# Patient Record
Sex: Female | Born: 1960 | Race: Black or African American | Hispanic: No | Smoking: Current every day smoker
Health system: Southern US, Community
[De-identification: ages and names within clinical notes are randomized; demographics above are authoritative.]

## PROBLEM LIST (undated history)

## (undated) DIAGNOSIS — F419 Anxiety disorder, unspecified: Secondary | ICD-10-CM

## (undated) DIAGNOSIS — F32A Depression, unspecified: Secondary | ICD-10-CM

## (undated) DIAGNOSIS — F319 Bipolar disorder, unspecified: Secondary | ICD-10-CM

## (undated) DIAGNOSIS — E785 Hyperlipidemia, unspecified: Secondary | ICD-10-CM

## (undated) DIAGNOSIS — F329 Major depressive disorder, single episode, unspecified: Secondary | ICD-10-CM

## (undated) HISTORY — DX: Hyperlipidemia, unspecified: E78.5

## (undated) HISTORY — DX: Major depressive disorder, single episode, unspecified: F32.9

## (undated) HISTORY — DX: Anxiety disorder, unspecified: F41.9

## (undated) HISTORY — PX: TUMOR EXCISION: SHX421

## (undated) HISTORY — PX: ABDOMINAL HYSTERECTOMY: SHX81

## (undated) HISTORY — DX: Depression, unspecified: F32.A

---

## 1998-12-05 ENCOUNTER — Emergency Department (HOSPITAL_COMMUNITY): Admission: EM | Admit: 1998-12-05 | Discharge: 1998-12-05 | Payer: Self-pay | Admitting: Emergency Medicine

## 1998-12-05 ENCOUNTER — Encounter: Payer: Self-pay | Admitting: Emergency Medicine

## 2000-11-29 ENCOUNTER — Emergency Department (HOSPITAL_COMMUNITY): Admission: EM | Admit: 2000-11-29 | Discharge: 2000-11-29 | Payer: Self-pay | Admitting: Emergency Medicine

## 2000-11-29 ENCOUNTER — Encounter: Payer: Self-pay | Admitting: Emergency Medicine

## 2002-12-19 ENCOUNTER — Emergency Department (HOSPITAL_COMMUNITY): Admission: AD | Admit: 2002-12-19 | Discharge: 2002-12-19 | Payer: Self-pay | Admitting: Family Medicine

## 2005-04-18 ENCOUNTER — Emergency Department (HOSPITAL_COMMUNITY): Admission: EM | Admit: 2005-04-18 | Discharge: 2005-04-18 | Payer: Self-pay | Admitting: Family Medicine

## 2005-11-14 ENCOUNTER — Emergency Department (HOSPITAL_COMMUNITY): Admission: EM | Admit: 2005-11-14 | Discharge: 2005-11-14 | Payer: Self-pay | Admitting: Emergency Medicine

## 2006-06-11 ENCOUNTER — Emergency Department (HOSPITAL_COMMUNITY): Admission: EM | Admit: 2006-06-11 | Discharge: 2006-06-11 | Payer: Self-pay | Admitting: Emergency Medicine

## 2006-09-13 ENCOUNTER — Emergency Department (HOSPITAL_COMMUNITY): Admission: EM | Admit: 2006-09-13 | Discharge: 2006-09-13 | Payer: Self-pay | Admitting: Emergency Medicine

## 2006-12-04 ENCOUNTER — Emergency Department (HOSPITAL_COMMUNITY): Admission: EM | Admit: 2006-12-04 | Discharge: 2006-12-04 | Payer: Self-pay | Admitting: Emergency Medicine

## 2007-11-13 ENCOUNTER — Emergency Department (HOSPITAL_COMMUNITY): Admission: EM | Admit: 2007-11-13 | Discharge: 2007-11-13 | Payer: Self-pay | Admitting: Emergency Medicine

## 2008-04-25 ENCOUNTER — Emergency Department (HOSPITAL_COMMUNITY): Admission: EM | Admit: 2008-04-25 | Discharge: 2008-04-25 | Payer: Self-pay | Admitting: Emergency Medicine

## 2008-04-28 ENCOUNTER — Emergency Department (HOSPITAL_COMMUNITY): Admission: EM | Admit: 2008-04-28 | Discharge: 2008-04-28 | Payer: Self-pay | Admitting: Emergency Medicine

## 2008-10-04 IMAGING — CR DG CHEST 2V
2 series · 2 of 2 positions shown · non-contrast
Comparison: No comparison.

CLINICAL DATA: Fever, weakness, and cough.
 CHEST - 2 VIEW:

[w chest pa]
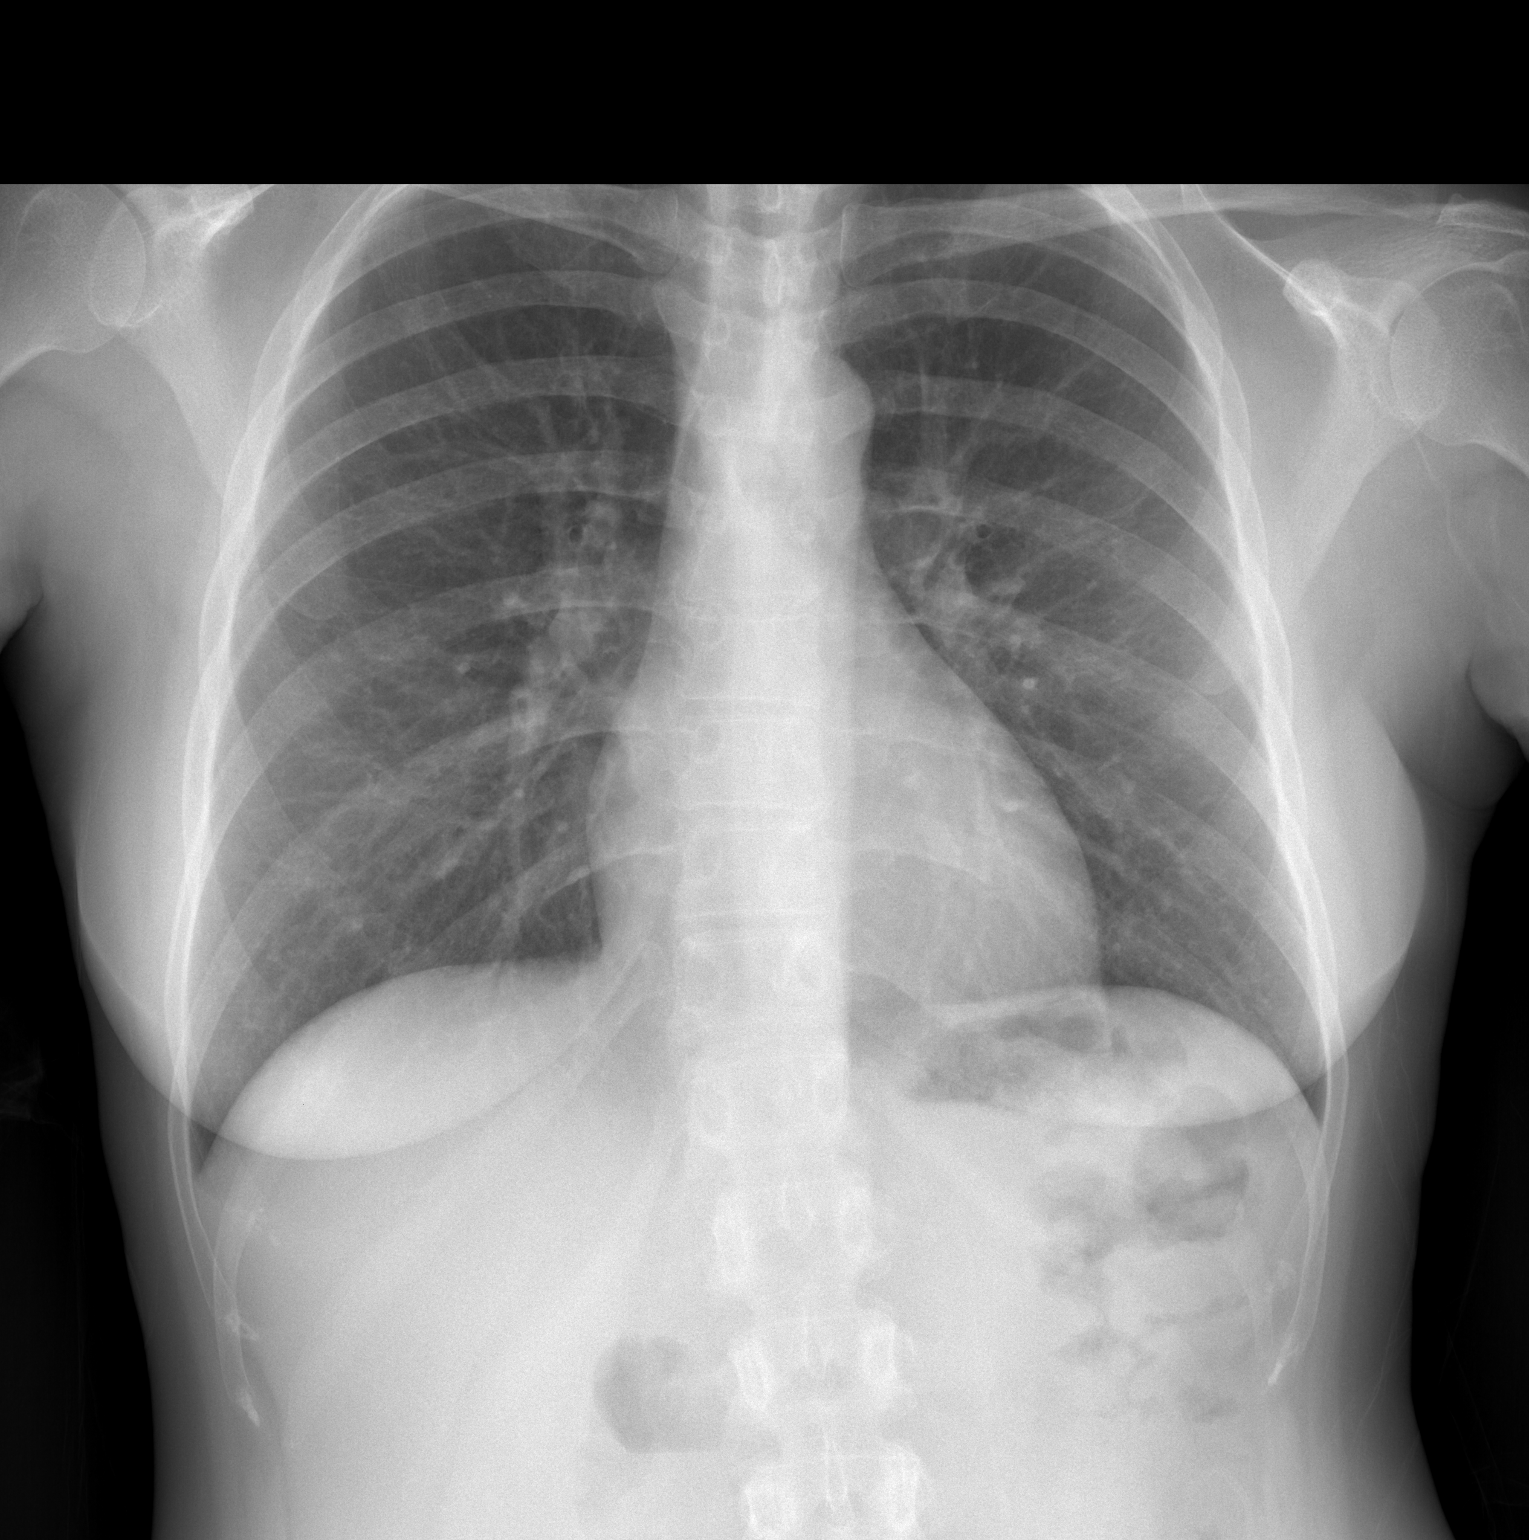

[w chest lat]
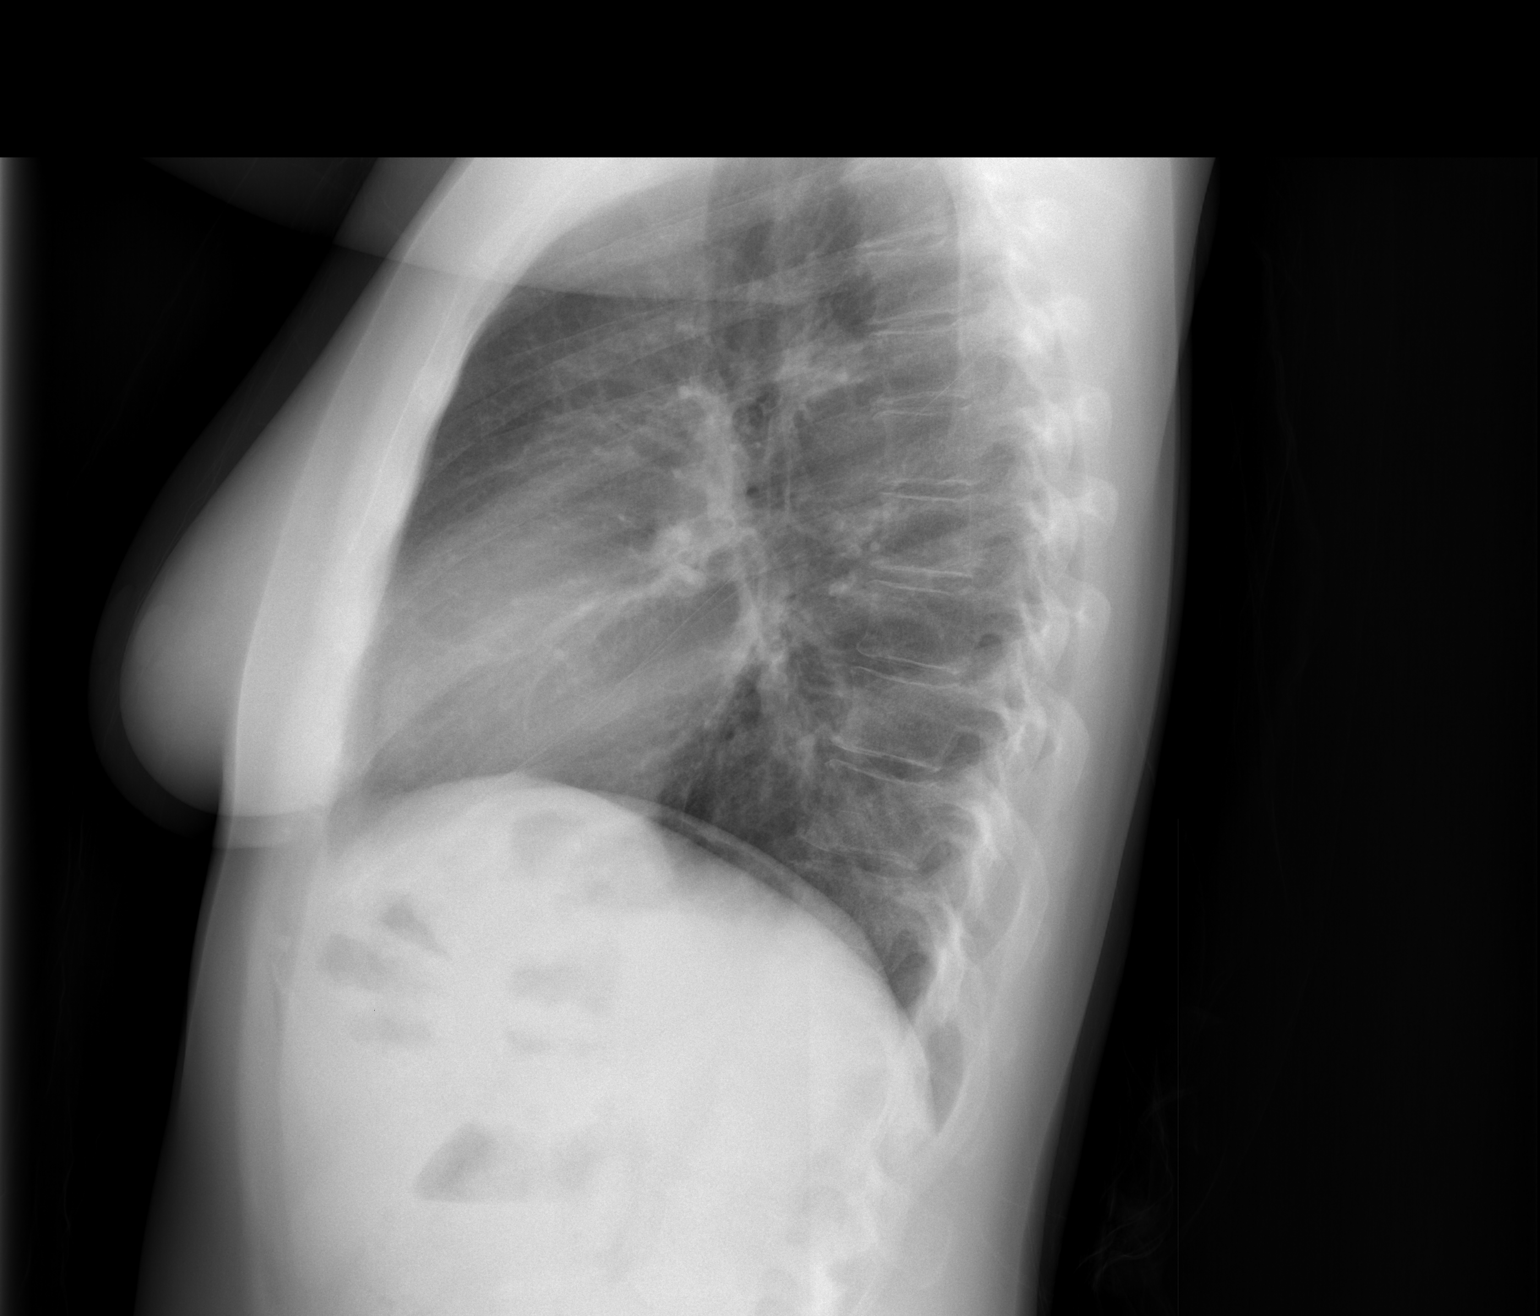

[2 of 2 positions shown; findings below may reference images not displayed]

FINDINGS: Heart size is normal. There is no infiltrate or effusion.  No mass is identified.
IMPRESSION: No acute abnormality.

## 2010-02-20 ENCOUNTER — Emergency Department (HOSPITAL_COMMUNITY)
Admission: EM | Admit: 2010-02-20 | Discharge: 2010-02-20 | Payer: Self-pay | Source: Home / Self Care | Admitting: Emergency Medicine

## 2010-05-13 ENCOUNTER — Emergency Department (HOSPITAL_COMMUNITY)
Admission: EM | Admit: 2010-05-13 | Discharge: 2010-05-13 | Disposition: A | Discharge status: Home / Self Care | Payer: Self-pay | Attending: Emergency Medicine | Admitting: Emergency Medicine

## 2010-05-13 ENCOUNTER — Emergency Department (HOSPITAL_COMMUNITY): Payer: Self-pay

## 2010-05-13 DIAGNOSIS — R0989 Other specified symptoms and signs involving the circulatory and respiratory systems: Secondary | ICD-10-CM | POA: Insufficient documentation

## 2010-05-13 DIAGNOSIS — R509 Fever, unspecified: Secondary | ICD-10-CM | POA: Insufficient documentation

## 2010-05-13 DIAGNOSIS — R0609 Other forms of dyspnea: Secondary | ICD-10-CM | POA: Insufficient documentation

## 2010-05-13 DIAGNOSIS — F319 Bipolar disorder, unspecified: Secondary | ICD-10-CM | POA: Insufficient documentation

## 2010-05-13 DIAGNOSIS — R112 Nausea with vomiting, unspecified: Secondary | ICD-10-CM | POA: Insufficient documentation

## 2010-05-13 DIAGNOSIS — R059 Cough, unspecified: Secondary | ICD-10-CM | POA: Insufficient documentation

## 2010-05-13 DIAGNOSIS — R05 Cough: Secondary | ICD-10-CM | POA: Insufficient documentation

## 2010-05-13 DIAGNOSIS — J329 Chronic sinusitis, unspecified: Secondary | ICD-10-CM | POA: Insufficient documentation

## 2010-05-13 DIAGNOSIS — J069 Acute upper respiratory infection, unspecified: Secondary | ICD-10-CM | POA: Insufficient documentation

## 2010-05-19 LAB — DIFFERENTIAL
Basophils Absolute: 0.1 10*3/uL (ref 0.0–0.1)
Basophils Relative: 0 % (ref 0–1)
Eosinophils Absolute: 0.1 10*3/uL (ref 0.0–0.7)
Monocytes Absolute: 0.7 10*3/uL (ref 0.1–1.0)
Neutro Abs: 6.5 10*3/uL (ref 1.7–7.7)
Neutrophils Relative %: 60 % (ref 43–77)

## 2010-05-19 LAB — POCT I-STAT, CHEM 8
Calcium, Ion: 1.16 mmol/L (ref 1.12–1.32)
Chloride: 104 mEq/L (ref 96–112)
HCT: 40 % (ref 36.0–46.0)
Hemoglobin: 13.6 g/dL (ref 12.0–15.0)

## 2010-05-19 LAB — CBC
MCHC: 33.3 g/dL (ref 30.0–36.0)
RDW: 13.3 % (ref 11.5–15.5)

## 2010-08-10 ENCOUNTER — Ambulatory Visit (INDEPENDENT_AMBULATORY_CARE_PROVIDER_SITE_OTHER): Payer: Self-pay

## 2010-08-10 ENCOUNTER — Inpatient Hospital Stay (INDEPENDENT_AMBULATORY_CARE_PROVIDER_SITE_OTHER)
Admission: RE | Admit: 2010-08-10 | Discharge: 2010-08-10 | Disposition: A | Discharge status: Home / Self Care | Payer: Self-pay | Source: Ambulatory Visit | Attending: Family Medicine | Admitting: Family Medicine

## 2010-08-10 DIAGNOSIS — J40 Bronchitis, not specified as acute or chronic: Secondary | ICD-10-CM

## 2010-11-16 LAB — URINALYSIS, ROUTINE W REFLEX MICROSCOPIC
Ketones, ur: NEGATIVE
Nitrite: NEGATIVE
Protein, ur: NEGATIVE
Urobilinogen, UA: 0.2
pH: 7

## 2010-11-16 LAB — DIFFERENTIAL
Eosinophils Absolute: 0.2
Eosinophils Relative: 1
Lymphocytes Relative: 14
Lymphs Abs: 1.7
Monocytes Absolute: 0.6
Monocytes Relative: 5

## 2010-11-16 LAB — CBC
HCT: 39.1
Hemoglobin: 13.2
MCV: 99.2
RBC: 3.95
WBC: 12.3 — ABNORMAL HIGH

## 2010-11-16 LAB — WET PREP, GENITAL: Yeast Wet Prep HPF POC: NONE SEEN

## 2010-11-21 LAB — POCT URINALYSIS DIP (DEVICE)
Glucose, UA: NEGATIVE
Operator id: 247071
Specific Gravity, Urine: 1.025
Urobilinogen, UA: 1

## 2010-11-21 LAB — URINALYSIS, ROUTINE W REFLEX MICROSCOPIC
Ketones, ur: 15 — AB
Nitrite: NEGATIVE
Specific Gravity, Urine: 1.023
pH: 6

## 2010-11-21 LAB — RAPID URINE DRUG SCREEN, HOSP PERFORMED
Amphetamines: NOT DETECTED
Barbiturates: NOT DETECTED
Cocaine: NOT DETECTED
Opiates: NOT DETECTED
Tetrahydrocannabinol: NOT DETECTED

## 2010-11-21 LAB — URINE MICROSCOPIC-ADD ON

## 2010-11-21 LAB — URINE CULTURE

## 2011-01-11 ENCOUNTER — Emergency Department (HOSPITAL_COMMUNITY): Payer: Self-pay

## 2011-01-11 ENCOUNTER — Encounter: Payer: Self-pay | Admitting: *Deleted

## 2011-01-11 ENCOUNTER — Emergency Department (HOSPITAL_COMMUNITY)
Admission: EM | Admit: 2011-01-11 | Discharge: 2011-01-11 | Disposition: A | Discharge status: Home / Self Care | Payer: Self-pay | Attending: Emergency Medicine | Admitting: Emergency Medicine

## 2011-01-11 DIAGNOSIS — R142 Eructation: Secondary | ICD-10-CM | POA: Insufficient documentation

## 2011-01-11 DIAGNOSIS — R10819 Abdominal tenderness, unspecified site: Secondary | ICD-10-CM | POA: Insufficient documentation

## 2011-01-11 DIAGNOSIS — Z9889 Other specified postprocedural states: Secondary | ICD-10-CM | POA: Insufficient documentation

## 2011-01-11 DIAGNOSIS — R109 Unspecified abdominal pain: Secondary | ICD-10-CM | POA: Insufficient documentation

## 2011-01-11 DIAGNOSIS — R141 Gas pain: Secondary | ICD-10-CM | POA: Insufficient documentation

## 2011-01-11 DIAGNOSIS — R143 Flatulence: Secondary | ICD-10-CM | POA: Insufficient documentation

## 2011-01-11 LAB — DIFFERENTIAL
Basophils Absolute: 0 10*3/uL (ref 0.0–0.1)
Basophils Relative: 0 % (ref 0–1)
Eosinophils Absolute: 0.2 10*3/uL (ref 0.0–0.7)
Eosinophils Relative: 2 % (ref 0–5)
Monocytes Absolute: 0.8 10*3/uL (ref 0.1–1.0)
Monocytes Relative: 8 % (ref 3–12)

## 2011-01-11 LAB — POCT I-STAT, CHEM 8
Calcium, Ion: 1.14 mmol/L (ref 1.12–1.32)
Creatinine, Ser: 1 mg/dL (ref 0.50–1.10)
Glucose, Bld: 99 mg/dL (ref 70–99)
HCT: 40 % (ref 36.0–46.0)
Hemoglobin: 13.6 g/dL (ref 12.0–15.0)
Potassium: 3.7 mEq/L (ref 3.5–5.1)
TCO2: 23 mmol/L (ref 0–100)

## 2011-01-11 LAB — CBC
HCT: 37.4 % (ref 36.0–46.0)
Hemoglobin: 12.7 g/dL (ref 12.0–15.0)
MCH: 32.9 pg (ref 26.0–34.0)
MCHC: 34 g/dL (ref 30.0–36.0)
MCV: 96.9 fL (ref 78.0–100.0)
RDW: 12.7 % (ref 11.5–15.5)

## 2011-01-11 MED ORDER — MORPHINE SULFATE 4 MG/ML IJ SOLN: 4.0000 mg | Freq: Once | INTRAMUSCULAR | Status: DC

## 2011-01-11 MED ORDER — HYDROCODONE-ACETAMINOPHEN 5-325 MG PO TABS: 2.0000 | ORAL_TABLET | ORAL | Status: AC | PRN

## 2011-01-11 MED ORDER — MORPHINE SULFATE 2 MG/ML IJ SOLN
INTRAMUSCULAR | Status: AC
  Administered 2011-01-11: 4 mg via INTRAVENOUS
  Filled 2011-01-11: qty 2

## 2011-01-11 MED ORDER — ONDANSETRON HCL 4 MG/2ML IJ SOLN
4.0000 mg | Freq: Once | INTRAMUSCULAR | Status: AC
  Administered 2011-01-11: 4 mg via INTRAVENOUS
  Filled 2011-01-11: qty 2

## 2011-01-11 MED ORDER — ONDANSETRON HCL 4 MG PO TABS: 4.0000 mg | ORAL_TABLET | Freq: Four times a day (QID) | ORAL | Status: AC

## 2011-01-11 MED ORDER — IOHEXOL 300 MG/ML  SOLN
100.0000 mL | Freq: Once | INTRAMUSCULAR | Status: AC | PRN
  Administered 2011-01-11: 100 mL via INTRAVENOUS

## 2011-01-11 NOTE — ED Notes (Signed)
Pt states she had a colonoscopy and Endo last week, has been having abd pain and gas since, pt reports one stool that was black and sticky since test.

## 2011-01-11 NOTE — ED Notes (Signed)
C/o no BM since Sunday--had endoscopy and colonoscopy last week. States had dark stool prior to studies and afterwards.

## 2011-01-11 NOTE — ED Provider Notes (Addendum)
Seen with physician assistant Mr. Laveda Norman .complain of diffuse abdominal pain onset one day after colonoscopy and upper endoscopy last week. Pain is gas in nature. Admits to nausea no vomiting. No fever. No other associated symptoms. On exam alert nontoxic abdomen nondistended normal active bowel sounds diffusely tender no guarding no rigidity no rebound.  Doug Sou, MD 01/11/11 1521  Doug Sou, MD 01/12/11 0700

## 2011-01-11 NOTE — ED Provider Notes (Signed)
History    this is a 50 year old female presents to the ED with a chief complaint of left-sided abdominal pain, with black sticky stool. Patient states, for the past month she has had intermittent black stool and left-sided abdominal pain. She has been seen by her primary care doctor and was referred to an endoscopy and colonoscopy. She had both procedure performed a week ago. Patient states, for the past few days her pain has returned. The pain is constant, sharp, worse with movement or having bowel movement. She has nausea without vomiting. She has been belching more so than usual. She is unable to have a bowel movement for the past 2-3 days. She noticed the abdomen is distended more so than usual. She denies fever, chills, chest pain or shortness of breath, or dysuria.  She admits to having family history of ulcerative colitis, colon cancer, and abdominal cancer. She states her colonoscopy and endoscopy result were unremarkable.    CSN: [id number] Arrival date & time: 01/11/2011  2:02 PM   First MD Initiated Contact with Patient 01/11/11 1410      Chief Complaint  Patient presents with  . Abdominal Pain    (Consider location/radiation/quality/duration/timing/severity/associated sxs/prior treatment) HPI  History reviewed. No pertinent past medical history.  Past Surgical History  Procedure Date  . Abdominal hysterectomy   . Tumor excision     No family history on file.  History  Substance Use Topics  . Smoking status: Current Everyday Smoker  . Smokeless tobacco: Not on file  . Alcohol Use: Yes     social occ drink    OB History    Grav Para Term Preterm Abortions TAB SAB Ect Mult Living                  Review of Systems  All other systems reviewed and are negative.    Allergies  Codeine and Penicillins  Home Medications  No current outpatient prescriptions on file.  BP 129/85  Pulse 93  Temp(Src) 97.1 F (36.2 C) (Oral)  Resp 18  SpO2 99%  Physical Exam    Constitutional: She is oriented to person, place, and time. She appears well-developed and well-nourished. No distress.  HENT:  Head: Normocephalic and atraumatic.  Eyes: Conjunctivae are normal.  Neck: Normal range of motion. Neck supple.  Cardiovascular: Normal rate and regular rhythm.  Exam reveals no gallop and no friction rub.   No murmur heard. Pulmonary/Chest: Effort normal. No respiratory distress. She has no wheezes.  Abdominal: Bowel sounds are normal. She exhibits distension. There is tenderness in the periumbilical area, left upper quadrant and left lower quadrant. There is guarding. There is no rebound, no tenderness at McBurney's point and negative Murphy's sign. No hernia. Hernia confirmed negative in the ventral area and confirmed negative in the right inguinal area.  Musculoskeletal: Normal range of motion.  Neurological: She is alert and oriented to person, place, and time.    ED Course  Procedures (including critical care time)  Labs Reviewed - No data to display No results found.   No diagnosis found.  Results for orders placed during the hospital encounter of 01/11/11  CBC      Component Value Range   WBC 10.3  4.0 - 10.5 (K/uL)   RBC 3.86 (*) 3.87 - 5.11 (MIL/uL)   Hemoglobin 12.7  12.0 - 15.0 (g/dL)   HCT 95.6  38.7 - 56.4 (%)   MCV 96.9  78.0 - 100.0 (fL)   MCH 32.9  26.0 - 34.0 (pg)   MCHC 34.0  30.0 - 36.0 (g/dL)   RDW 16.1  09.6 - 04.5 (%)   Platelets 349  150 - 400 (K/uL)  DIFFERENTIAL      Component Value Range   Neutrophils Relative 44  43 - 77 (%)   Neutro Abs 4.6  1.7 - 7.7 (K/uL)   Lymphocytes Relative 46  12 - 46 (%)   Lymphs Abs 4.7 (*) 0.7 - 4.0 (K/uL)   Monocytes Relative 8  3 - 12 (%)   Monocytes Absolute 0.8  0.1 - 1.0 (K/uL)   Eosinophils Relative 2  0 - 5 (%)   Eosinophils Absolute 0.2  0.0 - 0.7 (K/uL)   Basophils Relative 0  0 - 1 (%)   Basophils Absolute 0.0  0.0 - 0.1 (K/uL)  OCCULT BLOOD, POC DEVICE      Component Value  Range   Fecal Occult Bld NEGATIVE    POCT I-STAT, CHEM 8      Component Value Range   Sodium 139  135 - 145 (mEq/L)   Potassium 3.7  3.5 - 5.1 (mEq/L)   Chloride 105  96 - 112 (mEq/L)   BUN 18  6 - 23 (mg/dL)   Creatinine, Ser 4.09  0.50 - 1.10 (mg/dL)   Glucose, Bld 99  70 - 99 (mg/dL)   Calcium, Ion 8.11  9.14 - 1.32 (mmol/L)   TCO2 23  0 - 100 (mmol/L)   Hemoglobin 13.6  12.0 - 15.0 (g/dL)   HCT 78.2  95.6 - 21.3 (%)   Ct Abdomen Pelvis W Contrast  01/11/2011  *RADIOLOGY REPORT*  Clinical Data: Abdominal pain.  History of colonoscopy 1 week ago.  CT ABDOMEN AND PELVIS WITH CONTRAST  Technique:  Multidetector CT imaging of the abdomen and pelvis was performed following the standard protocol during bolus administration of intravenous contrast.  Contrast: OMNIPAQUE IOHEXOL 300 MG/ML IV SOLN  Comparison: None  Findings: The lung bases are clear.  The liver is normal.  No focal lesions or intrahepatic biliary dilatation.  The gallbladder is normal.  No common bile duct dilatation.  The pancreas is normal.  The spleen is normal in size. No focal lesions.  The adrenal glands and kidneys are normal.  The stomach, duodenum, small bowel and colon are unremarkable except for mild fibrofatty infiltrative changes involving the colon. Findings could be due to prior inflammatory bowel disease. No acute inflammation.  No mass lesions.  The appendix is normal.  The uterus is surgically absent.  The ovaries are not identified. The bladder is normal.  No pelvic mass, adenopathy or free pelvic fluid collections.  The aorta is normal in caliber.  Mild atherosclerotic calcifications but no focal aneurysm or dissection.  The major branch vessels are patent.  The portal and splenic veins are patent.  No mesenteric or retroperitoneal masses or lymphadenopathy.  IMPRESSION:  1.  Mild fibrofatty infiltrative changes involving the submucosal region of the colon typically seen with prior inflammatory bowel disease.  No  definite acute inflammation. 2.  No acute abdominal/pelvic findings, mass lesions or lymphadenopathy. 3.  Age advanced atherosclerotic changes involving the aorta and iliac arteries.  Original Report Authenticated By: P. Loralie Champagne, M.D.   Dg Abd Acute W/chest  01/11/2011  *RADIOLOGY REPORT*  Clinical Data: Pain, nausea, gas, recent endoscopy and colonoscopy  ACUTE ABDOMEN SERIES (ABDOMEN 2 VIEW & CHEST 1 VIEW)  Comparison: Chest radiograph 08/10/2010  Findings: Upper normal heart size. Mediastinal contours  and pulmonary vascularity normal. Minimal chronic peribronchial thickening. Minimal subsegmental atelectasis versus scarring lingula. No acute infiltrate, pleural effusion or pneumothorax. No acute osseous findings. Bowel gas pattern normal. No bowel dilatation, bowel wall thickening or free intraperitoneal air. Tiny pelvic phleboliths. No definite urinary tract calcification.  IMPRESSION: Minimal chronic bronchitic changes. No acute abdominal findings.  Original Report Authenticated By: Lollie Marrow, M.D.      MDM  Increased abdominal pain after endoscopy and colonoscopy, worrisome for possible perforation. To obtain an abdominal CT scan for further evaluation.  I have discussed this with my attending who has seen and evaluated patient and agreed with my plan.    6:48 PM Patient has normal electrolytes and normal H&H with no evidence of infection.  Hemoccult is negative. Abdominal CT scan shows no evidence of bowel perforation or any acute abnormalities.  There is age advanced arthrosclerosis change involving the aorta and iliac arteries which patient is made aware. With no acute findings on CT scan, and patient having normal vitals, patient will be discharged and followup with her GI doctor.  Patient is able to tolerate by mouth      Fayrene Helper, PA 01/11/11 1907

## 2011-01-12 NOTE — ED Provider Notes (Signed)
Medical screening examination/treatment/procedure(s) were conducted as a shared visit with non-physician practitioner(s) and myself.  I personally evaluated the patient during the encounter  Doug Sou, MD 01/12/11 0700

## 2012-11-11 IMAGING — CT CT ABD-PELV W/ CM
1 of 3 series · 14 of 32 positions shown, 19 images · IV contrast (APPLIED)
Comparison: None

CLINICAL DATA: Abdominal pain.  History of colonoscopy 1 week ago.

CT ABDOMEN AND PELVIS WITH CONTRAST
TECHNIQUE: Multidetector CT imaging of the abdomen and pelvis was
performed following the standard protocol during bolus
administration of intravenous contrast.
Contrast: 100mL OMNIPAQUE IOHEXOL 300 MG/ML IV SOLN

[Series 2: abd/pel with · axial · 0.74mm/px · z∈[+1021,+1401]mm · 14 of 86 slices shown, 19 images]
[im 5/86  soft-tissue]
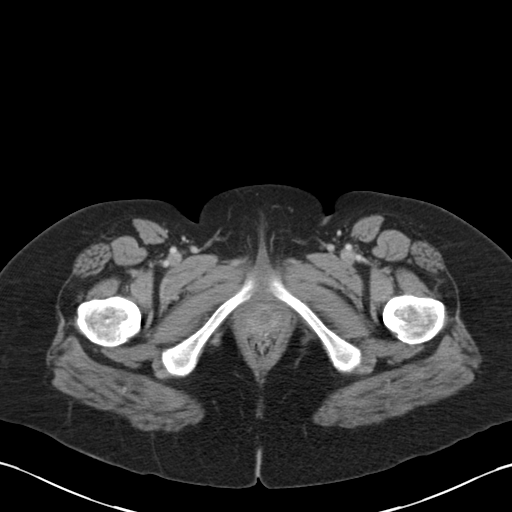
[im 5/86  bone]
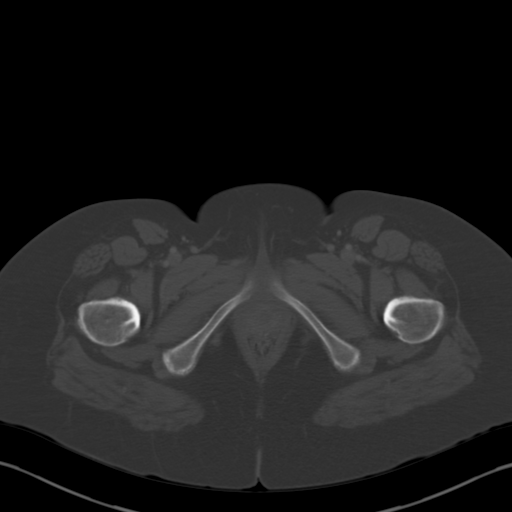
[im 14/86  soft-tissue]
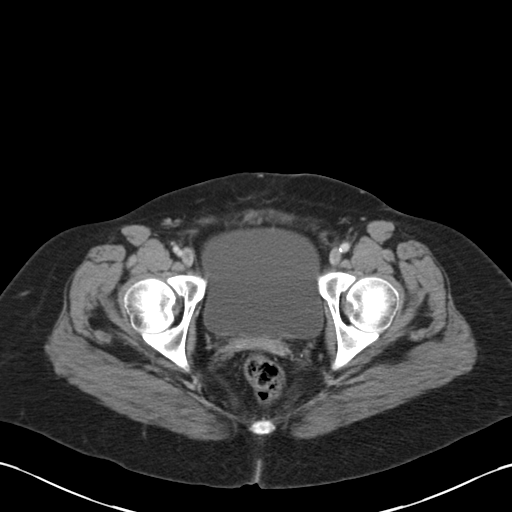
[im 18/86  soft-tissue]
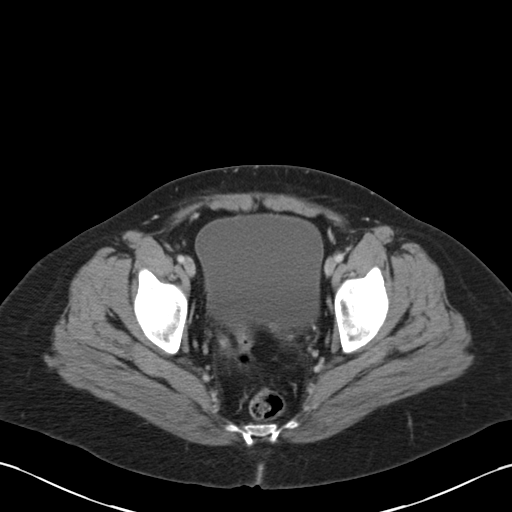
[im 23/86  soft-tissue]
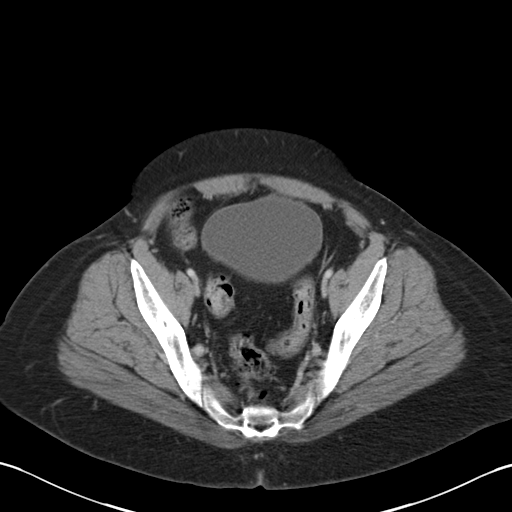
[im 32/86  soft-tissue]
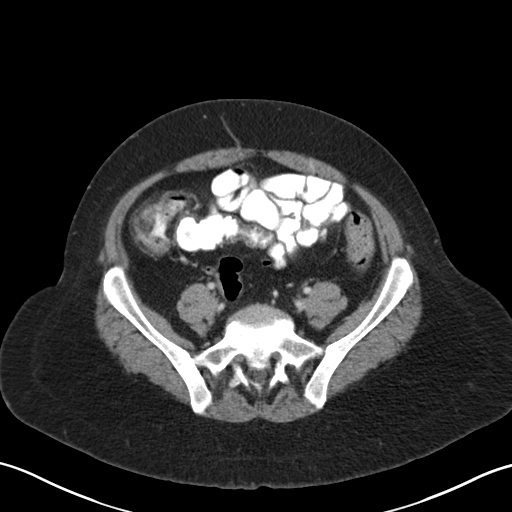
[im 36/86  soft-tissue]
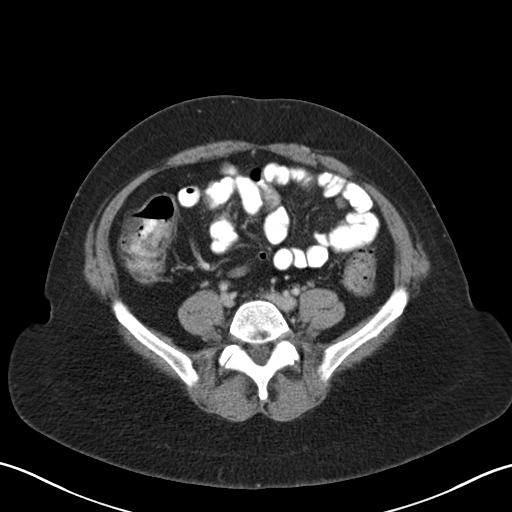
[im 45/86  soft-tissue]
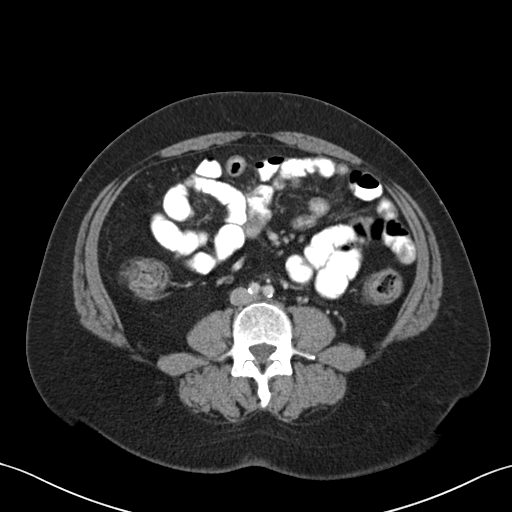
[im 50/86  soft-tissue]
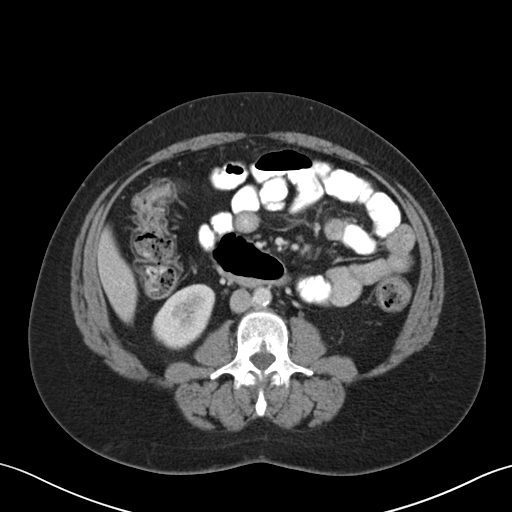
[im 54/86  soft-tissue]
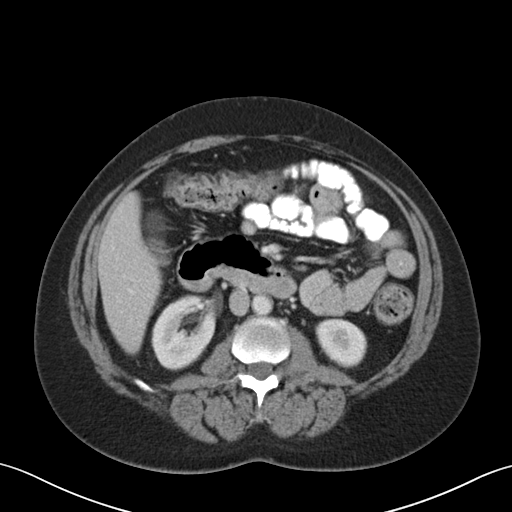
[im 54/86  bone]
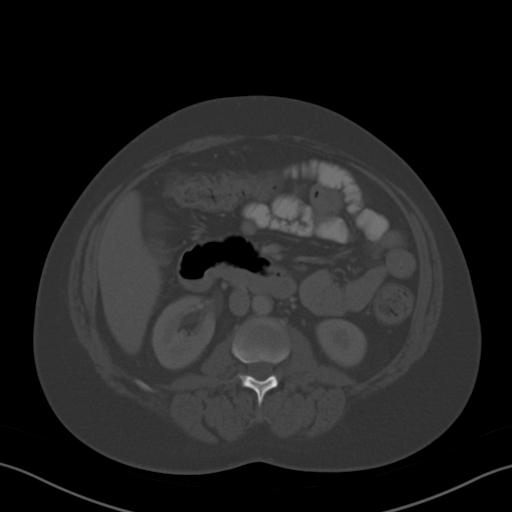
[im 63/86  soft-tissue]
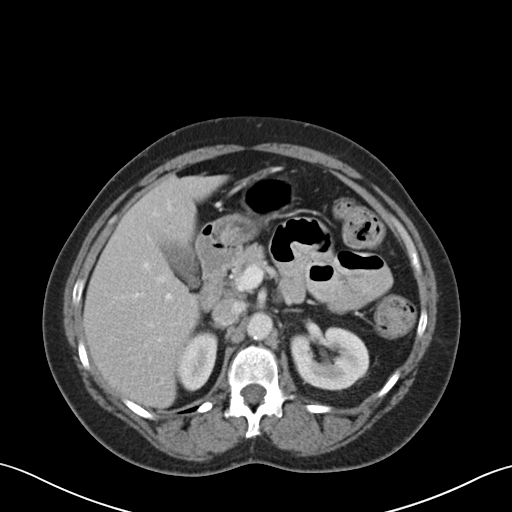
[im 68/86  soft-tissue]
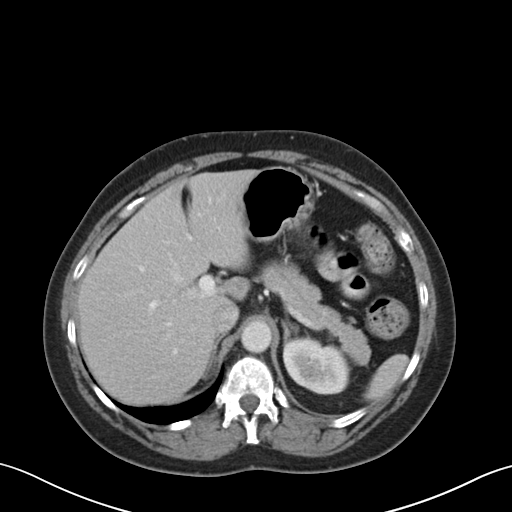
[im 68/86  lung]
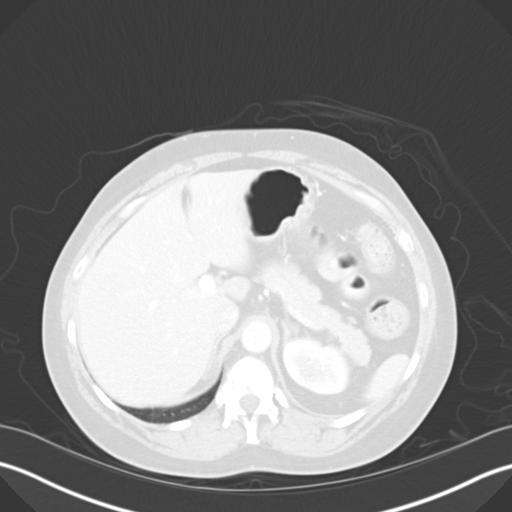
[im 72/86  soft-tissue]
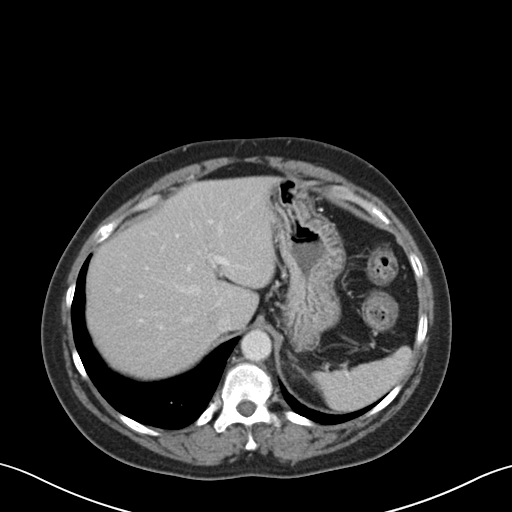
[im 72/86  lung]
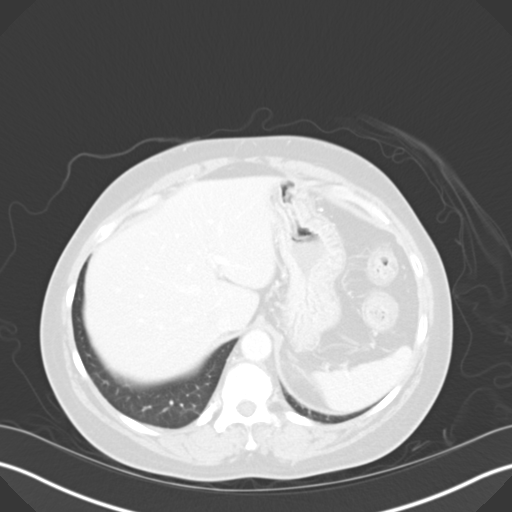
[im 77/86  lung]
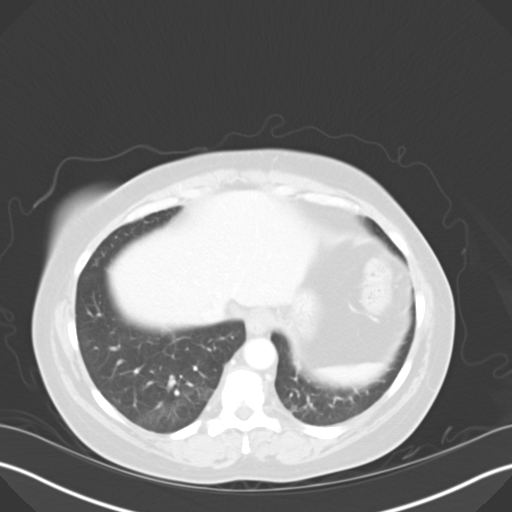
[im 81/86  soft-tissue]
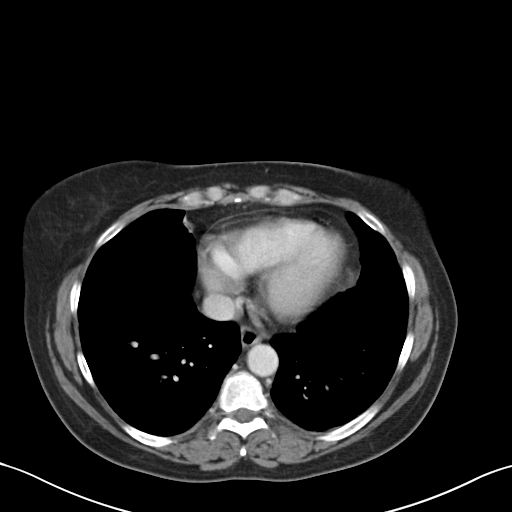
[im 81/86  lung]
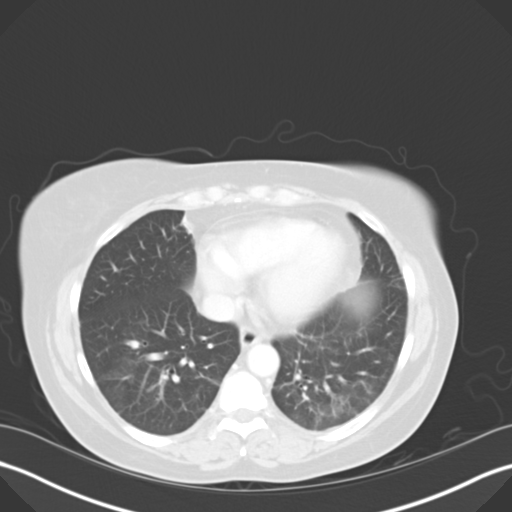

[14 of 32 positions shown; findings below may reference images not displayed]

FINDINGS: The lung bases are clear.

The liver is normal.  No focal lesions or intrahepatic biliary
dilatation.  The gallbladder is normal.  No common bile duct
dilatation.  The pancreas is normal.  The spleen is normal in size.
No focal lesions.  The adrenal glands and kidneys are normal.

The stomach, duodenum, small bowel and colon are unremarkable
except for mild fibrofatty infiltrative changes involving the
colon. Findings could be due to prior inflammatory bowel disease.
No acute inflammation.  No mass lesions.  The appendix is normal.

The uterus is surgically absent.  The ovaries are not identified.
The bladder is normal.  No pelvic mass, adenopathy or free pelvic
fluid collections.

The aorta is normal in caliber.  Mild atherosclerotic
calcifications but no focal aneurysm or dissection.  The major
branch vessels are patent.  The portal and splenic veins are
patent.  No mesenteric or retroperitoneal masses or
lymphadenopathy.
IMPRESSION: 1.  Mild fibrofatty infiltrative changes involving the submucosal
region of the colon typically seen with prior inflammatory bowel
disease.  No definite acute inflammation.
2.  No acute abdominal/pelvic findings, mass lesions or
lymphadenopathy.
3.  Age advanced atherosclerotic changes involving the aorta and
iliac arteries.

## 2013-10-14 ENCOUNTER — Emergency Department (HOSPITAL_COMMUNITY)
Admission: EM | Admit: 2013-10-14 | Discharge: 2013-10-14 | Disposition: A | Discharge status: Home / Self Care | Payer: No Typology Code available for payment source | Attending: Emergency Medicine | Admitting: Emergency Medicine

## 2013-10-14 ENCOUNTER — Encounter (HOSPITAL_COMMUNITY): Payer: Self-pay | Admitting: Emergency Medicine

## 2013-10-14 ENCOUNTER — Emergency Department (HOSPITAL_COMMUNITY): Payer: No Typology Code available for payment source

## 2013-10-14 DIAGNOSIS — M2559 Pain in other specified joint: Secondary | ICD-10-CM | POA: Diagnosis not present

## 2013-10-14 DIAGNOSIS — M79609 Pain in unspecified limb: Secondary | ICD-10-CM

## 2013-10-14 DIAGNOSIS — Z880 Allergy status to penicillin: Secondary | ICD-10-CM | POA: Insufficient documentation

## 2013-10-14 DIAGNOSIS — F17200 Nicotine dependence, unspecified, uncomplicated: Secondary | ICD-10-CM | POA: Diagnosis not present

## 2013-10-14 DIAGNOSIS — R109 Unspecified abdominal pain: Secondary | ICD-10-CM | POA: Diagnosis not present

## 2013-10-14 DIAGNOSIS — F319 Bipolar disorder, unspecified: Secondary | ICD-10-CM | POA: Diagnosis not present

## 2013-10-14 DIAGNOSIS — Z79899 Other long term (current) drug therapy: Secondary | ICD-10-CM | POA: Insufficient documentation

## 2013-10-14 DIAGNOSIS — M7989 Other specified soft tissue disorders: Secondary | ICD-10-CM

## 2013-10-14 DIAGNOSIS — I8390 Asymptomatic varicose veins of unspecified lower extremity: Secondary | ICD-10-CM | POA: Diagnosis not present

## 2013-10-14 DIAGNOSIS — M2550 Pain in unspecified joint: Secondary | ICD-10-CM

## 2013-10-14 HISTORY — DX: Bipolar disorder, unspecified: F31.9

## 2013-10-14 LAB — BASIC METABOLIC PANEL
Anion gap: 12 (ref 5–15)
BUN: 13 mg/dL (ref 6–23)
CHLORIDE: 106 meq/L (ref 96–112)
CO2: 24 meq/L (ref 19–32)
CREATININE: 0.92 mg/dL (ref 0.50–1.10)
Calcium: 9.3 mg/dL (ref 8.4–10.5)
GFR calc Af Amer: 82 mL/min — ABNORMAL LOW (ref >90)
GFR calc non Af Amer: 70 mL/min — ABNORMAL LOW (ref >90)
GLUCOSE: 102 mg/dL — AB (ref 70–99)
POTASSIUM: 4.8 meq/L (ref 3.7–5.3)
Sodium: 142 mEq/L (ref 137–147)

## 2013-10-14 LAB — CBC WITH DIFFERENTIAL/PLATELET
Basophils Absolute: 0 10*3/uL (ref 0.0–0.1)
Basophils Relative: 0 % (ref 0–1)
Eosinophils Absolute: 0.1 10*3/uL (ref 0.0–0.7)
Eosinophils Relative: 2 % (ref 0–5)
HCT: 38 % (ref 36.0–46.0)
HEMOGLOBIN: 12.6 g/dL (ref 12.0–15.0)
LYMPHS ABS: 3 10*3/uL (ref 0.7–4.0)
Lymphocytes Relative: 37 % (ref 12–46)
MCH: 33.4 pg (ref 26.0–34.0)
MCHC: 33.2 g/dL (ref 30.0–36.0)
MCV: 100.8 fL — ABNORMAL HIGH (ref 78.0–100.0)
MONO ABS: 0.5 10*3/uL (ref 0.1–1.0)
MONOS PCT: 6 % (ref 3–12)
NEUTROS ABS: 4.6 10*3/uL (ref 1.7–7.7)
NEUTROS PCT: 55 % (ref 43–77)
Platelets: 343 10*3/uL (ref 150–400)
RBC: 3.77 MIL/uL — AB (ref 3.87–5.11)
RDW: 12.8 % (ref 11.5–15.5)
WBC: 8.3 10*3/uL (ref 4.0–10.5)

## 2013-10-14 LAB — SEDIMENTATION RATE: Sed Rate: 8 mm/hr (ref 0–22)

## 2013-10-14 MED ORDER — IBUPROFEN 800 MG PO TABS: 800.0000 mg | ORAL_TABLET | Freq: Three times a day (TID) | ORAL | Status: DC

## 2013-10-14 MED ORDER — TRAMADOL HCL 50 MG PO TABS
50.0000 mg | ORAL_TABLET | Freq: Four times a day (QID) | ORAL | Status: DC | PRN
Start: 2013-10-14 — End: 2016-01-25

## 2013-10-14 NOTE — ED Provider Notes (Signed)
CSN: [id number]     Arrival date & time 10/14/13  1445 History   First MD Initiated Contact with Patient 10/14/13 1732     Chief Complaint  Patient presents with  . Leg Pain  . Groin Pain  . Arm Pain     (Consider location/radiation/quality/duration/timing/severity/associated sxs/prior Treatment) HPI Comments: Patient presents to the ER for evaluation of left ankle and leg pain. Patient reports that she started to notice swelling on the outside portion of her ankle earlier today. She is inpatient care, is on her feet a lot. She denies any direct injury, however. Over the course of the day she noticed increased pain up the leg and now only to the groin. No numbness or tingling. No back pain. Patient has not had any fever. There is no redness or overlying skin changes at the ankle or other joints.  Patient is a 53 y.o. female presenting with leg pain, groin pain, and arm pain.  Leg Pain Associated symptoms: no fever   Groin Pain Pertinent negatives include no chest pain and no shortness of breath.  Arm Pain Pertinent negatives include no chest pain and no shortness of breath.    Past Medical History  Diagnosis Date  . Bipolar 1 disorder    Past Surgical History  Procedure Laterality Date  . Abdominal hysterectomy     History reviewed. No pertinent family history. History  Substance Use Topics  . Smoking status: Current Every Day Smoker    Types: Cigarettes  . Smokeless tobacco: Not on file  . Alcohol Use: Yes   OB History   Grav Para Term Preterm Abortions TAB SAB Ect Mult Living                 Review of Systems  Constitutional: Negative for fever.  Respiratory: Negative for shortness of breath.   Cardiovascular: Negative for chest pain.  Musculoskeletal: Positive for arthralgias.  All other systems reviewed and are negative.     Allergies  Codeine; Hydrocortisone; and Penicillins  Home Medications   Prior to Admission medications   Medication Sig Start Date  End Date Taking? Authorizing Provider  ALPRAZolam Prudy Feeler) 1 MG tablet Take 1 mg by mouth 2 (two) times daily as needed for anxiety.   Yes Historical Provider, MD  ibuprofen (ADVIL,MOTRIN) 800 MG tablet Take 1 tablet (800 mg total) by mouth 3 (three) times daily. 10/14/13   Gilda Crease, MD  lamoTRIgine (LAMICTAL) 200 MG tablet Take 200 mg by mouth daily.   Yes Historical Provider, MD  loratadine (CLARITIN) 10 MG tablet Take 10 mg by mouth daily.   Yes Historical Provider, MD  traMADol (ULTRAM) 50 MG tablet Take 1 tablet (50 mg total) by mouth every 6 (six) hours as needed. 10/14/13   Gilda Crease, MD   BP 143/86  Pulse 88  Temp(Src) 98.7 F (37.1 C) (Oral)  Resp 16  SpO2 99% Physical Exam  Constitutional: She is oriented to person, place, and time. She appears well-developed and well-nourished. No distress.  HENT:  Head: Normocephalic and atraumatic.  Right Ear: Hearing normal.  Left Ear: Hearing normal.  Nose: Nose normal.  Mouth/Throat: Oropharynx is clear and moist and mucous membranes are normal.  Eyes: Conjunctivae and EOM are normal. Pupils are equal, round, and reactive to light.  Neck: Normal range of motion. Neck supple.  Cardiovascular: Regular rhythm, S1 normal and S2 normal.  Exam reveals no gallop and no friction rub.   No murmur heard. Pulmonary/Chest: Effort normal  and breath sounds normal. No respiratory distress. She exhibits no tenderness.  Abdominal: Soft. Normal appearance and bowel sounds are normal. There is no hepatosplenomegaly. There is no tenderness. There is no rebound, no guarding, no tenderness at McBurney's point and negative Murphy's sign. No hernia.  Musculoskeletal: Normal range of motion.       Left ankle: She exhibits swelling (lateral). She exhibits no ecchymosis and no deformity. Tenderness. Lateral malleolus tenderness found.       Legs: Neurological: She is alert and oriented to person, place, and time. She has normal strength. No  cranial nerve deficit or sensory deficit. Coordination normal. GCS eye subscore is 4. GCS verbal subscore is 5. GCS motor subscore is 6.  Skin: Skin is warm, dry and intact. No rash noted. No cyanosis.  Psychiatric: She has a normal mood and affect. Her speech is normal and behavior is normal. Thought content normal.    ED Course  Procedures (including critical care time) Labs Review Labs Reviewed  CBC WITH DIFFERENTIAL - Abnormal; Notable for the following:    RBC 3.77 (*)    MCV 100.8 (*)    All other components within normal limits  BASIC METABOLIC PANEL - Abnormal; Notable for the following:    Glucose, Bld 102 (*)    GFR calc non Af Amer 70 (*)    GFR calc Af Amer 82 (*)    All other components within normal limits  SEDIMENTATION RATE    Imaging Review Dg Ankle Complete Left  10/14/2013   CLINICAL DATA:  Left ankle pain.  No acute injury.  EXAM: LEFT ANKLE COMPLETE - 3+ VIEW  COMPARISON:  None.  FINDINGS: Bony mineralization is normal. The ankle is located. No acute or healing fracture or joint space abnormalities identified. No focal bony abnormality.  There is some increased density anterior to the ankle joint on the lateral view, for which an ankle joint effusion cannot be excluded.  IMPRESSION: 1. No acute bony abnormality. 2. Cannot exclude ankle joint effusion.   Electronically Signed   By: Britta Mccreedy M.D.   On: 10/14/2013 18:55     EKG Interpretation None      MDM   Final diagnoses:  Arthralgia  Varicose vein of leg   Patient presents to the ER for evaluation of acute onset pain and swelling of the left ankle. She denies any direct injury. She has not had any tick bites. Patient does not history of fever and there is no rash. There is no warmth to the joint no overlying erythema, no concern for septic joint. Additionally sedimentation rate was 8. X-ray does not show any acute changes, possibly a small effusion present. For this, we'll have the patient follow up with  orthopedics as soon as possible. Patient was told that she needs to come back to the ER immediately if she runs fever, swelling worsens, pain worsens or the area becomes red or warm to the touch. She understands this.  The patient was complaining of pain going up the leg. She did have some varicose veins in the medial aspect of the calf have not been present previously. She did not, however, have a positive Homan sign. There is no obvious circumferential swelling of the calf. Venous evaluation was performed and there is no DVT or other abnormality seen. Patient requesting referral to a vascular surgeon for her varicose veins. She is on her feet all day and the varicose veins are very painful for her.  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 She understands return precautions.    Gilda Crease, MD 10/14/13 2012

## 2013-10-14 NOTE — Progress Notes (Signed)
Left lower extremity venous duplex completed.  Left:  No evidence of DVT, superficial thrombosis, or Baker's cyst.  Right:  Negative for DVT in the common femoral vein.  

## 2013-10-14 NOTE — Discharge Instructions (Signed)
Arthralgia °Your caregiver has diagnosed you as suffering from an arthralgia. Arthralgia means there is pain in a joint. This can come from many reasons including: °· Bruising the joint which causes soreness (inflammation) in the joint. °· Wear and tear on the joints which occur as we grow older (osteoarthritis). °· Overusing the joint. °· Various forms of arthritis. °· Infections of the joint. °Regardless of the cause of pain in your joint, most of these different pains respond to anti-inflammatory drugs and rest. The exception to this is when a joint is infected, and these cases are treated with antibiotics, if it is a bacterial infection. °HOME CARE INSTRUCTIONS  °· Rest the injured area for as long as directed by your caregiver. Then slowly start using the joint as directed by your caregiver and as the pain allows. Crutches as directed may be useful if the ankles, knees or hips are involved. If the knee was splinted or casted, continue use and care as directed. If an stretchy or elastic wrapping bandage has been applied today, it should be removed and re-applied every 3 to 4 hours. It should not be applied tightly, but firmly enough to keep swelling down. Watch toes and feet for swelling, bluish discoloration, coldness, numbness or excessive pain. If any of these problems (symptoms) occur, remove the ace bandage and re-apply more loosely. If these symptoms persist, contact your caregiver or return to this location. °· For the first 24 hours, keep the injured extremity elevated on pillows while lying down. °· Apply ice for 15-20 minutes to the sore joint every couple hours while awake for the first half day. Then 03-04 times per day for the first 48 hours. Put the ice in a plastic bag and place a towel between the bag of ice and your skin. °· Wear any splinting, casting, elastic bandage applications, or slings as instructed. °· Only take over-the-counter or prescription medicines for pain, discomfort, or fever as  directed by your caregiver. Do not use aspirin immediately after the injury unless instructed by your physician. Aspirin can cause increased bleeding and bruising of the tissues. °· If you were given crutches, continue to use them as instructed and do not resume weight bearing on the sore joint until instructed. °Persistent pain and inability to use the sore joint as directed for more than 2 to 3 days are warning signs indicating that you should see a caregiver for a follow-up visit as soon as possible. Initially, a hairline fracture (break in bone) may not be evident on X-rays. Persistent pain and swelling indicate that further evaluation, non-weight bearing or use of the joint (use of crutches or slings as instructed), or further X-rays are indicated. X-rays may sometimes not show a small fracture until a week or 10 days later. Make a follow-up appointment with your own caregiver or one to whom we have referred you. A radiologist (specialist in reading X-rays) may read your X-rays. Make sure you know how you are to obtain your X-ray results. Do not assume everything is normal if you do not hear from us. °SEEK MEDICAL CARE IF: °Bruising, swelling, or pain increases. °SEEK IMMEDIATE MEDICAL CARE IF:  °· Your fingers or toes are numb or blue. °· The pain is not responding to medications and continues to stay the same or get worse. °· The pain in your joint becomes severe. °· You develop a fever over 102° F (38.9° C). °· It becomes impossible to move or use the joint. °MAKE SURE YOU:  °·   Understand these instructions.  Will watch your condition.  Will get help right away if you are not doing well or get worse. Document Released: 01/23/2005 Document Revised: 04/17/2011 Document Reviewed: 09/11/2007 [facility] Patient Information 2015 [place], [place]. This information is not intended to replace advice given to you by your health care provider. Make sure you discuss any questions you have with your health care  provider.  Varicose Veins Varicose veins are veins that have become enlarged and twisted. CAUSES This condition is the result of valves in the veins not working properly. Valves in the veins help return blood from the leg to the heart. If these valves are damaged, blood flows backwards and backs up into the veins in the leg near the skin. This causes the veins to become larger. People who are on their feet a lot, who are pregnant, or who are overweight are more likely to develop varicose veins. SYMPTOMS   Bulging, twisted-appearing, bluish veins, most commonly found on the legs.  Leg pain or a feeling of heaviness. These symptoms may be worse at the end of the day.  Leg swelling.  Skin color changes. DIAGNOSIS  Varicose veins can usually be diagnosed with an exam of your legs by your caregiver. He or she may recommend an ultrasound of your leg veins. TREATMENT  Most varicose veins can be treated at home.However, other treatments are available for people who have persistent symptoms or who want to treat the cosmetic appearance of the varicose veins. These include:  Laser treatment of very small varicose veins.  Medicine that is shot (injected) into the vein. This medicine hardens the walls of the vein and closes off the vein. This treatment is called sclerotherapy. Afterwards, you may need to wear clothing or bandages that apply pressure.  Surgery. HOME CARE INSTRUCTIONS   Do not stand or sit in one position for long periods of time. Do not sit with your legs crossed. Rest with your legs raised during the day.  Wear elastic stockings or support hose. Do not wear other tight, encircling garments around the legs, pelvis, or waist.  Walk as much as possible to increase blood flow.  Raise the foot of your bed at night with 2-inch blocks.  If you get a cut in the skin over the vein and the vein bleeds, lie down with your leg raised and press on it with a clean cloth until the bleeding  stops. Then place a bandage (dressing) on the cut. See your caregiver if it continues to bleed or needs stitches. SEEK MEDICAL CARE IF:   The skin around your ankle starts to break down.  You have pain, redness, tenderness, or hard swelling developing in your leg over a vein.  You are uncomfortable due to leg pain. Document Released: 11/02/2004 Document Revised: 04/17/2011 Document Reviewed: 03/21/2010 [facility] Patient Information 2015 [place], [place]. This information is not intended to replace advice given to you by your health care provider. Make sure you discuss any questions you have with your health care provider.

## 2013-10-14 NOTE — ED Notes (Signed)
Pt c/o pain that started in her left ankle that moved up her leg to her left groin area and then while at work today started having pain in left arm.  Pt thinks her left ankle could be swollen pt not forsure.

## 2013-10-28 ENCOUNTER — Other Ambulatory Visit: Payer: Self-pay | Admitting: *Deleted

## 2013-10-28 DIAGNOSIS — I83893 Varicose veins of bilateral lower extremities with other complications: Secondary | ICD-10-CM

## 2013-11-11 ENCOUNTER — Encounter: Payer: Self-pay | Admitting: Vascular Surgery

## 2013-11-12 ENCOUNTER — Encounter: Payer: No Typology Code available for payment source | Admitting: Vascular Surgery

## 2013-11-12 ENCOUNTER — Encounter (HOSPITAL_COMMUNITY): Payer: No Typology Code available for payment source

## 2014-05-22 ENCOUNTER — Other Ambulatory Visit: Payer: Self-pay | Admitting: *Deleted

## 2014-05-22 ENCOUNTER — Encounter: Payer: Self-pay | Admitting: Surgery

## 2014-05-22 DIAGNOSIS — I83813 Varicose veins of bilateral lower extremities with pain: Secondary | ICD-10-CM

## 2014-07-15 ENCOUNTER — Encounter: Payer: Self-pay | Admitting: Surgery

## 2014-07-20 ENCOUNTER — Ambulatory Visit (HOSPITAL_COMMUNITY)
Admission: RE | Admit: 2014-07-20 | Discharge: 2014-07-20 | Disposition: A | Discharge status: Home / Self Care | Payer: 59 | Source: Ambulatory Visit | Attending: Surgery | Admitting: Surgery

## 2014-07-20 ENCOUNTER — Encounter: Payer: Self-pay | Admitting: Surgery

## 2014-07-20 ENCOUNTER — Ambulatory Visit (INDEPENDENT_AMBULATORY_CARE_PROVIDER_SITE_OTHER): Payer: 59 | Admitting: Surgery

## 2014-07-20 VITALS — BP 161/77 | HR 76 | Temp 98.4°F | Resp 16 | Ht 60.0 in | Wt 148.6 lb

## 2014-07-20 DIAGNOSIS — I83813 Varicose veins of bilateral lower extremities with pain: Secondary | ICD-10-CM | POA: Diagnosis present

## 2014-07-20 DIAGNOSIS — I872 Venous insufficiency (chronic) (peripheral): Secondary | ICD-10-CM | POA: Diagnosis not present

## 2014-07-20 NOTE — Progress Notes (Signed)
Patient name: Betty Berry MRN: [id number] DOB: 02-13-1960 Sex: female   Referred by: Dr. Karle Starch  Reason for referral:  Chief Complaint  Patient presents with  . Venous Insufficiency    BLE pain and swelling  L > R  No injury, history of working at [employer] walking on hard floors     HISTORY OF PRESENT ILLNESS: This is a 54 year old female who comes in today for evaluation of lower extremity pain and swelling.  The patient reports that she began having symptoms approximately 2 years ago.  They have gotten progressively worse.  Her left leg bothers her more so than the right.  She complains of swelling which is worse at the end of the day.  She also complains of pain and tenderness which is exacerbated with walking.  She does wear compression stockings which provide her some benefit.  She is having trouble completing her daily work tasks because of discomfort in her legs.  The patient suffers from bipolar disease.  She is a current smoker but would like to quit.  She states that she is taking medication for hypercholesterolemia  Past Medical History  Diagnosis Date  . Anxiety and depression   . Hyperlipidemia     Past Surgical History  Procedure Laterality Date  . Abdominal hysterectomy    . Tumor excision      Right arm,  fatty tumor per patient    History   Social History  . Marital Status: Married    Spouse Name: N/A  . Number of Children: N/A  . Years of Education: N/A   Occupational History  . Not on file.   Social History Main Topics  . Smoking status: Current Every Day Smoker -- 0.25 packs/day    Types: Cigarettes  . Smokeless tobacco: Not on file  . Alcohol Use: Yes     Comment: social occ drink  . Drug Use: No  . Sexual Activity: Yes    Birth Control/ Protection: Surgical   Other Topics Concern  . Not on file   Social History Narrative    Family History  Problem Relation Age of Onset  . Diabetes Mother   . Hyperlipidemia Mother   .  Hypertension Mother   . Varicose Veins Mother     Allergies as of 07/20/2014 - Review Complete 07/20/2014  Allergen Reaction Noted  . Codeine Itching 01/11/2011  . Penicillins Itching 01/11/2011    Current Outpatient Prescriptions on File Prior to Visit  Medication Sig Dispense Refill  . acetaminophen (TYLENOL) 500 MG tablet Take 500 mg by mouth every 6 (six) hours as needed. pain     . Multiple Vitamins-Minerals (MULTIVITAMINS THER. W/MINERALS) TABS Take 1 tablet by mouth daily.      Marland Kitchen omeprazole (PRILOSEC) 40 MG capsule Take 40 mg by mouth daily.      . Probiotic Product (ALIGN) 4 MG CAPS Take 4 mg by mouth daily.       No current facility-administered medications on file prior to visit.     REVIEW OF SYSTEMS: Cardiovascular: Positive for pain in legs on walking and when lying flat.  Positive for swelling in legs.  Positive for varicose veins. Pulmonary: No productive cough or wheezing.  Positive for asthma Neurologic: No weakness, paresthesias, aphasia, or amaurosis. No dizziness. Hematologic: No bleeding problems or clotting disorders. Musculoskeletal: No joint pain or joint swelling. Gastrointestinal: No blood in stool or hematemesis Genitourinary: No dysuria or hematuria. Psychiatric:: No history of major depression.  Integumentary: No rashes or ulcers. Constitutional: No fever or chills.  PHYSICAL EXAMINATION:  Filed Vitals:   07/20/14 1452  BP: 161/77  Pulse: 76  Temp: 98.4 F (36.9 C)  TempSrc: Oral  Resp: 16  Height: 5' (1.524 m)  Weight: 148 lb 9.6 oz (67.405 kg)  SpO2: 98%   Body mass index is 29.02 kg/(m^2). General: The patient appears their stated age.   HEENT:  No gross abnormalities Pulmonary: Respirations are non-labored Musculoskeletal: There are no major deformities.   Neurologic: No focal weakness or paresthesias are detected, Skin: There are no ulcer or rashes noted. Psychiatric: The patient has normal affect. Cardiovascular: There is a  regular rate and rhythm without significant murmur appreciated.  Palpable pedal pulses bilaterally.  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  Pitting edema bilaterally  Diagnostic Studies: Venous reflux exam has been evaluated by myself.  The right leg shows deep vein reflux with no significant superficial venous system reflux.  On the left there is also a deep vein reflux as well as superficial venous insufficiency with maximum diameter of 0.73 cm of the left great saphenous vein.    Assessment:  Chronic venous insufficiency, left greater than right Plan: Patient's symptoms of swelling and pain are becoming more and more intolerable for her.  Her left leg bothers her more so than the right.  By reflux examination, the left leg has both deep and superficial venous insufficiency.  I have recommended her wearing 20-30 thigh-high compression stockings and coming back for repeat evaluation in 3 months to see how she is doing.  I've also encouraged leg elevation and ibuprofen for pain.  I think that she would be a good candidate for endovenous laser ablation of the left great saphenous vein and stab phlebectomy's of the varicosities in the left leg.     Juleen China IV, M.D. [facility] Office: [phone] Pager:  [phone]

## 2014-10-26 ENCOUNTER — Encounter: Payer: Self-pay | Admitting: Vascular Surgery

## 2014-10-27 ENCOUNTER — Ambulatory Visit: Payer: 59 | Admitting: Vascular Surgery

## 2015-11-22 ENCOUNTER — Encounter (HOSPITAL_COMMUNITY): Payer: Self-pay | Admitting: Emergency Medicine

## 2015-11-22 ENCOUNTER — Ambulatory Visit (HOSPITAL_COMMUNITY)
Admission: EM | Admit: 2015-11-22 | Discharge: 2015-11-22 | Disposition: A | Discharge status: Home / Self Care | Payer: No Typology Code available for payment source | Attending: Family Medicine | Admitting: Family Medicine

## 2015-11-22 DIAGNOSIS — J209 Acute bronchitis, unspecified: Secondary | ICD-10-CM

## 2015-11-22 MED ORDER — PREDNISONE 20 MG PO TABS: ORAL_TABLET | ORAL | 0 refills | Status: DC

## 2015-11-22 MED ORDER — HYDROCODONE-HOMATROPINE 5-1.5 MG/5ML PO SYRP: 5.0000 mL | ORAL_SOLUTION | Freq: Four times a day (QID) | ORAL | 0 refills | Status: DC | PRN

## 2015-11-22 MED ORDER — LEVOFLOXACIN 500 MG PO TABS: 500.0000 mg | ORAL_TABLET | Freq: Every day | ORAL | 0 refills | Status: DC

## 2015-11-22 NOTE — ED Provider Notes (Signed)
[facility]    CSN: [id number] Arrival date & time: 11/22/15  1039     History   Chief Complaint Chief Complaint  Patient presents with  . Cough  . Chest Congestion  . Joint Swelling    HPI Betty Berry is a 55 y.o. female.   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 The patient is married and does smoke.  She comes in with 6 weeks of cough. She's had problems with pneumonia and COPD in the past. She uses 2 different inhalers.  She's been coughing so hard that she gags. She has some mild shortness of breath at times and feels exhausted. She's lost no weight and her appetite is not changed. There is been no hemoptysis.      Past Medical History:  Diagnosis Date  . Bipolar 1 disorder (HCC)     There are no active problems to display for this patient.   Past Surgical History:  Procedure Laterality Date  . ABDOMINAL HYSTERECTOMY      OB History    No data available       Home Medications    Prior to Admission medications   Medication Sig Start Date End Date Taking? Authorizing Provider  clonazePAM (KLONOPIN) 0.5 MG tablet Take 0.5 mg by mouth 2 (two) times daily as needed for anxiety.   Yes Historical Provider, MD  ALPRAZolam Prudy Feeler(XANAX) 1 MG tablet Take 1 mg by mouth 2 (two) times daily as needed for anxiety.    Historical Provider, MD  HYDROcodone-homatropine (HYCODAN) 5-1.5 MG/5ML syrup Take 5 mLs by mouth every 6 (six) hours as needed for cough. 11/22/15   Elvina SidleKurt Agness Sibrian, MD  ibuprofen (ADVIL,MOTRIN) 800 MG tablet Take 1 tablet (800 mg total) by mouth 3 (three) times daily. 10/14/13   Gilda Creasehristopher J Pollina, MD  lamoTRIgine (LAMICTAL) 200 MG tablet Take 200 mg by mouth daily.    Historical Provider, MD  levofloxacin (LEVAQUIN) 500 MG tablet Take 1 tablet (500 mg total) by mouth daily. 11/22/15   Elvina SidleKurt Trevar Boehringer, MD  loratadine (CLARITIN) 10 MG tablet Take 10 mg by mouth daily.    Historical Provider, MD    predniSONE (DELTASONE) 20 MG tablet Two daily with food 11/22/15   Elvina SidleKurt Deanza Upperman, MD  traMADol (ULTRAM) 50 MG tablet Take 1 tablet (50 mg total) by mouth every 6 (six) hours as needed. 10/14/13   Gilda Creasehristopher J Pollina, MD    Family History History reviewed. No pertinent family history.  Social History Social History  Substance Use Topics  . Smoking status: Current Every Day Smoker    Types: Cigarettes  . Smokeless tobacco: Current User     Comment: Reports she smoke 2-3 cigs per day   . Alcohol use Yes     Comment: Patient reports a glass of wine per day      Allergies   Codeine; Hydrocortisone; and Penicillins   Review of Systems Review of Systems  Constitutional: Positive for activity change and fatigue. Negative for chills, diaphoresis, fever and unexpected weight change.  HENT: Positive for congestion.   Eyes: Negative.   Respiratory: Positive for cough, chest tightness, shortness of breath and wheezing.   Cardiovascular: Negative.   Gastrointestinal: Positive for diarrhea and vomiting.  Endocrine: Negative for cold intolerance, polydipsia, polyphagia and polyuria.  Genitourinary: Negative.   Musculoskeletal: Positive for arthralgias.  Neurological: Negative.      Physical Exam Triage Vital Signs ED Triage Vitals  Enc Vitals  Group     BP 11/22/15 1149 146/76     Pulse Rate 11/22/15 1149 76     Resp 11/22/15 1149 16     Temp 11/22/15 1149 98.3 F (36.8 C)     Temp Source 11/22/15 1149 Oral     SpO2 11/22/15 1149 100 %     Weight --      Height --      Head Circumference --      Peak Flow --      Pain Score 11/22/15 1224 6     Pain Loc --      Pain Edu? --      Excl. in GC? --    No data found.   Updated Vital Signs BP 146/76 (BP Location: Left Arm)   Pulse 76   Temp 98.3 F (36.8 C) (Oral)   Resp 16   SpO2 100%       Physical Exam  Constitutional: She appears well-developed and well-nourished.  HENT:  Head: Normocephalic and atraumatic.   Right Ear: External ear normal.  Left Ear: External ear normal.  Nose: Nose normal.  Mouth/Throat: Oropharynx is clear and moist.  Eyes: Conjunctivae and EOM are normal. Pupils are equal, round, and reactive to light.  Neck: Thyromegaly present.  Cardiovascular: Normal rate, regular rhythm, normal heart sounds and intact distal pulses.   Pulmonary/Chest: Effort normal.  Rhonchi heard bilaterally  Musculoskeletal:  Subcutaneous lipoma palpated on the lateral malleolus of the left ankle  Skin: Skin is warm and dry.  Psychiatric: She has a normal mood and affect. Her behavior is normal.  Nursing note and vitals reviewed.    UC Treatments / Results  Labs (all labs ordered are listed, but only abnormal results are displayed) Labs Reviewed - No data to display  EKG  EKG Interpretation None       Radiology No results found.  Procedures Procedures (including critical care time)  Medications Ordered in UC Medications - No data to display   Initial Impression / Assessment and Plan / UC Course  I have reviewed the triage vital signs and the nursing notes.  Pertinent labs & imaging results that were available during my care of the patient were reviewed by me and considered in my medical decision making (see chart for details).  Clinical Course      Final Clinical Impressions(s) / UC Diagnoses   Final diagnoses:  Acute bronchitis, unspecified organism    New Prescriptions New Prescriptions   HYDROCODONE-HOMATROPINE (HYCODAN) 5-1.5 MG/5ML SYRUP    Take 5 mLs by mouth every 6 (six) hours as needed for cough.   LEVOFLOXACIN (LEVAQUIN) 500 MG TABLET    Take 1 tablet (500 mg total) by mouth daily.   PREDNISONE (DELTASONE) 20 MG TABLET    Two daily with food     Elvina Sidle, MD 11/22/15 1303

## 2015-11-22 NOTE — ED Triage Notes (Signed)
Patient presents today with a Cough and Chest congestion. Patient reports that she has pain in her side when she coughs. Patient also reports that this morning upon waking her Left Foot is swollen.

## 2015-11-22 NOTE — Discharge Instructions (Signed)
You should rest for the next 2 days. If you're not feeling better by Wednesday, would like you to come back so that we can recheck your lungs.

## 2016-01-11 ENCOUNTER — Encounter: Payer: Self-pay | Admitting: Surgery

## 2016-01-25 ENCOUNTER — Ambulatory Visit (HOSPITAL_COMMUNITY)
Admission: EM | Admit: 2016-01-25 | Discharge: 2016-01-25 | Disposition: A | Discharge status: Home / Self Care | Payer: Self-pay | Attending: Family Medicine | Admitting: Family Medicine

## 2016-01-25 ENCOUNTER — Encounter (HOSPITAL_COMMUNITY): Payer: Self-pay | Admitting: Family Medicine

## 2016-01-25 ENCOUNTER — Ambulatory Visit (INDEPENDENT_AMBULATORY_CARE_PROVIDER_SITE_OTHER): Payer: Self-pay

## 2016-01-25 DIAGNOSIS — S60222A Contusion of left hand, initial encounter: Secondary | ICD-10-CM

## 2016-01-25 DIAGNOSIS — J40 Bronchitis, not specified as acute or chronic: Secondary | ICD-10-CM

## 2016-01-25 DIAGNOSIS — J069 Acute upper respiratory infection, unspecified: Secondary | ICD-10-CM

## 2016-01-25 DIAGNOSIS — B9789 Other viral agents as the cause of diseases classified elsewhere: Secondary | ICD-10-CM

## 2016-01-25 MED ORDER — DOXYCYCLINE HYCLATE 100 MG PO CAPS: 100.0000 mg | ORAL_CAPSULE | Freq: Two times a day (BID) | ORAL | 0 refills | Status: AC

## 2016-01-25 MED ORDER — IPRATROPIUM BROMIDE 0.06 % NA SOLN: 2.0000 | Freq: Four times a day (QID) | NASAL | 1 refills | Status: AC

## 2016-01-25 NOTE — ED Triage Notes (Signed)
Pt here for URI symptoms. sts she had a coughing episode yesterday and passed out.

## 2016-01-25 NOTE — ED Provider Notes (Signed)
[facility]    CSN: [id number] Arrival date & time: 01/25/16  1025     History   Chief Complaint Chief Complaint  Patient presents with  . Cough  . Sore Throat    HPI Betty Berry is a 55 y.o. female.   The history is provided by the patient and the spouse.  Cough  Cough characteristics:  Non-productive Severity:  Moderate Onset quality:  Gradual Duration:  3 days Progression:  Unchanged Chronicity:  New Smoker: yes   Ineffective treatments:  None tried Associated symptoms: rhinorrhea and sore throat   Associated symptoms: no fever   Sore Throat     Past Medical History:  Diagnosis Date  . Anxiety and depression   . Bipolar 1 disorder (HCC)   . Hyperlipidemia     There are no active problems to display for this patient.   Past Surgical History:  Procedure Laterality Date  . ABDOMINAL HYSTERECTOMY    . TUMOR EXCISION     Right arm,  fatty tumor per patient    OB History    No data available       Home Medications    Prior to Admission medications   Medication Sig Start Date End Date Taking? Authorizing Provider  clonazePAM (KLONOPIN) 0.5 MG tablet Take 0.5 mg by mouth 2 (two) times daily as needed for anxiety.    Historical Provider, MD  doxycycline (VIBRAMYCIN) 100 MG capsule Take 1 capsule (100 mg total) by mouth 2 (two) times daily. 01/25/16   Linna HoffJames D Keyanna Sandefer, MD  ipratropium (ATROVENT) 0.06 % nasal spray Place 2 sprays into both nostrils 4 (four) times daily. 01/25/16   Linna HoffJames D Syeda Prickett, MD    Family History Family History  Problem Relation Age of Onset  . Diabetes Mother   . Hyperlipidemia Mother   . Hypertension Mother   . Varicose Veins Mother     Social History Social History  Substance Use Topics  . Smoking status: Current Every Day Smoker    Packs/day: 0.25    Types: Cigarettes  . Smokeless tobacco: Current User     Comment: Reports she smoke 2-3 cigs per day   . Alcohol use Yes     Comment: Patient reports a  glass of wine per day      Allergies   Codeine; Hydrocortisone; Penicillins; and Penicillins   Review of Systems Review of Systems  Constitutional: Negative.  Negative for fever.  HENT: Positive for congestion, postnasal drip, rhinorrhea and sore throat.   Respiratory: Positive for cough.   Cardiovascular: Negative.   Gastrointestinal: Negative.   Genitourinary: Negative.   Neurological: Positive for syncope.  Hematological: Negative.   All other systems reviewed and are negative.    Physical Exam Triage Vital Signs ED Triage Vitals  Enc Vitals Group     BP 01/25/16 1101 (!) 154/117     Pulse Rate 01/25/16 1101 83     Resp 01/25/16 1101 20     Temp 01/25/16 1101 98.6 F (37 C)     Temp src --      SpO2 01/25/16 1101 98 %     Weight --      Height --      Head Circumference --      Peak Flow --      Pain Score 01/25/16 1103 5     Pain Loc --      Pain Edu? --      Excl. in GC? --  No data found.   Updated Vital Signs BP (!) 154/117   Pulse 83   Temp 98.6 F (37 C)   Resp 20   SpO2 98%   Visual Acuity Right Eye Distance:   Left Eye Distance:   Bilateral Distance:    Right Eye Near:   Left Eye Near:    Bilateral Near:     Physical Exam  Constitutional: She is oriented to person, place, and time. She appears well-developed and well-nourished. No distress.  HENT:  Right Ear: External ear normal.  Left Ear: External ear normal.  Nose: Mucosal edema and rhinorrhea present.  Eyes: Conjunctivae and EOM are normal. Pupils are equal, round, and reactive to light.  Neck: Normal range of motion. Neck supple.  Cardiovascular: Normal rate, regular rhythm, normal heart sounds and intact distal pulses.   Pulmonary/Chest: Effort normal and breath sounds normal.  Lymphadenopathy:    She has no cervical adenopathy.  Neurological: She is alert and oriented to person, place, and time.  Skin: Skin is warm and dry.  Nursing note and vitals reviewed.    UC  Treatments / Results  Labs (all labs ordered are listed, but only abnormal results are displayed) Labs Reviewed - No data to display  EKG  EKG Interpretation None       Radiology No results found. X-rays reviewed and report per radiologist.  Procedures Procedures (including critical care time)  Medications Ordered in UC Medications - No data to display   Initial Impression / Assessment and Plan / UC Course  I have reviewed the triage vital signs and the nursing notes.  Pertinent labs & imaging results that were available during my care of the patient were reviewed by me and considered in my medical decision making (see chart for details).  Clinical Course as of Feb 02 2042  Tue Jan 25, 2016  1144 DG Hand Complete Left [JK]    Clinical Course User Index [JK] Linna HoffJames D Arbor Leer, MD      Final Clinical Impressions(s) / UC Diagnoses   Final diagnoses:  Viral URI with cough  Bronchitis  Contusion of left hand, initial encounter    New Prescriptions Discharge Medication List as of 01/25/2016 11:52 AM    START taking these medications   Details  doxycycline (VIBRAMYCIN) 100 MG capsule Take 1 capsule (100 mg total) by mouth 2 (two) times daily., Starting Tue 01/25/2016, Print    ipratropium (ATROVENT) 0.06 % nasal spray Place 2 sprays into both nostrils 4 (four) times daily., Starting Tue 01/25/2016, Print         Linna HoffJames D Lillyn Wieczorek, MD 02/03/16 2043

## 2020-10-09 ENCOUNTER — Emergency Department (HOSPITAL_COMMUNITY)
Admission: EM | Admit: 2020-10-09 | Discharge: 2020-10-10 | Disposition: A | Discharge status: Home / Self Care | Payer: Self-pay | Attending: Emergency Medicine | Admitting: Emergency Medicine

## 2020-10-09 ENCOUNTER — Other Ambulatory Visit: Payer: Self-pay

## 2020-10-09 ENCOUNTER — Encounter (HOSPITAL_COMMUNITY): Payer: Self-pay

## 2020-10-09 DIAGNOSIS — R209 Unspecified disturbances of skin sensation: Secondary | ICD-10-CM | POA: Insufficient documentation

## 2020-10-09 DIAGNOSIS — R0981 Nasal congestion: Secondary | ICD-10-CM | POA: Insufficient documentation

## 2020-10-09 DIAGNOSIS — R2243 Localized swelling, mass and lump, lower limb, bilateral: Secondary | ICD-10-CM | POA: Insufficient documentation

## 2020-10-09 DIAGNOSIS — Z20822 Contact with and (suspected) exposure to covid-19: Secondary | ICD-10-CM | POA: Insufficient documentation

## 2020-10-09 DIAGNOSIS — R519 Headache, unspecified: Secondary | ICD-10-CM | POA: Insufficient documentation

## 2020-10-09 DIAGNOSIS — N3000 Acute cystitis without hematuria: Secondary | ICD-10-CM | POA: Insufficient documentation

## 2020-10-09 DIAGNOSIS — R5383 Other fatigue: Secondary | ICD-10-CM | POA: Insufficient documentation

## 2020-10-09 DIAGNOSIS — F17210 Nicotine dependence, cigarettes, uncomplicated: Secondary | ICD-10-CM | POA: Insufficient documentation

## 2020-10-09 LAB — COMPREHENSIVE METABOLIC PANEL
ALT: 36 U/L (ref 0–44)
AST: 30 U/L (ref 15–41)
Albumin: 3.8 g/dL (ref 3.5–5.0)
Alkaline Phosphatase: 73 U/L (ref 38–126)
Anion gap: 7 (ref 5–15)
BUN: 15 mg/dL (ref 6–20)
CO2: 22 mmol/L (ref 22–32)
Calcium: 9.4 mg/dL (ref 8.9–10.3)
Chloride: 112 mmol/L — ABNORMAL HIGH (ref 98–111)
Creatinine, Ser: 0.59 mg/dL (ref 0.44–1.00)
GFR, Estimated: >60 mL/min (ref >60)
Glucose, Bld: 123 mg/dL — ABNORMAL HIGH (ref 70–99)
Potassium: 4.2 mmol/L (ref 3.5–5.1)
Sodium: 141 mmol/L (ref 135–145)
Total Bilirubin: 0.5 mg/dL (ref 0.3–1.2)
Total Protein: 7.3 g/dL (ref 6.5–8.1)

## 2020-10-09 LAB — URINALYSIS, ROUTINE W REFLEX MICROSCOPIC
Bilirubin Urine: NEGATIVE
Glucose, UA: 100 mg/dL — AB
Ketones, ur: NEGATIVE mg/dL
Nitrite: NEGATIVE
Protein, ur: NEGATIVE mg/dL
Specific Gravity, Urine: >1.03 — ABNORMAL HIGH (ref 1.005–1.030)
WBC, UA: >50 WBC/hpf — ABNORMAL HIGH (ref 0–5)
pH: 6 (ref 5.0–8.0)

## 2020-10-09 LAB — CBC WITH DIFFERENTIAL/PLATELET
Abs Immature Granulocytes: 0.03 10*3/uL (ref 0.00–0.07)
Basophils Absolute: 0 10*3/uL (ref 0.0–0.1)
Basophils Relative: 0 %
Eosinophils Absolute: 0.1 10*3/uL (ref 0.0–0.5)
Eosinophils Relative: 1 %
HCT: 40.5 % (ref 36.0–46.0)
Hemoglobin: 13 g/dL (ref 12.0–15.0)
Immature Granulocytes: 0 %
Lymphocytes Relative: 36 %
Lymphs Abs: 2.5 10*3/uL (ref 0.7–4.0)
MCH: 33 pg (ref 26.0–34.0)
MCHC: 32.1 g/dL (ref 30.0–36.0)
MCV: 102.8 fL — ABNORMAL HIGH (ref 80.0–100.0)
Monocytes Absolute: 0.7 10*3/uL (ref 0.1–1.0)
Monocytes Relative: 10 %
Neutro Abs: 3.6 10*3/uL (ref 1.7–7.7)
Neutrophils Relative %: 53 %
Platelets: 329 10*3/uL (ref 150–400)
RBC: 3.94 MIL/uL (ref 3.87–5.11)
RDW: 12.6 % (ref 11.5–15.5)
WBC: 6.9 10*3/uL (ref 4.0–10.5)
nRBC: 0 % (ref 0.0–0.2)

## 2020-10-09 LAB — RESP PANEL BY RT-PCR (FLU A&B, COVID) ARPGX2
Influenza A by PCR: NEGATIVE
Influenza B by PCR: NEGATIVE
SARS Coronavirus 2 by RT PCR: NEGATIVE

## 2020-10-09 LAB — LIPASE, BLOOD: Lipase: 40 U/L (ref 11–51)

## 2020-10-09 LAB — POC OCCULT BLOOD, ED: Fecal Occult Bld: NEGATIVE

## 2020-10-09 MED ORDER — SODIUM CHLORIDE 0.9 % IV BOLUS
1000.0000 mL | Freq: Once | INTRAVENOUS | Status: AC
  Administered 2020-10-09: 1000 mL via INTRAVENOUS

## 2020-10-09 MED ORDER — PROCHLORPERAZINE EDISYLATE 10 MG/2ML IJ SOLN
10.0000 mg | Freq: Once | INTRAMUSCULAR | Status: AC
  Administered 2020-10-09: 10 mg via INTRAVENOUS
  Filled 2020-10-09: qty 2

## 2020-10-09 MED ORDER — DIPHENHYDRAMINE HCL 50 MG/ML IJ SOLN
25.0000 mg | Freq: Once | INTRAMUSCULAR | Status: AC
  Administered 2020-10-09: 25 mg via INTRAVENOUS
  Filled 2020-10-09: qty 1

## 2020-10-09 NOTE — ED Provider Notes (Signed)
Emergency Medicine Provider Triage Evaluation Note  Betty Berry , a 60 y.o. female  was evaluated in triage.  Pt complains of left lower leg swelling with tenderness since this morning.  Denies any shortness of breath or chest pain.  She also states that she has had a headache has been intermittent for 2 weeks. It hurts behind bilateral eyes. She says that she feels weak all over and has muscle aches.  Denies any nausea, vomiting, abdominal pain, diarrhea, congestion, sore throat.   Review of Systems  Positive: Headache, myalgias, leg swelling Negative: Dyspnea, chest pain, n/v/d, congestion, sore throat.  Physical Exam  BP (!) 169/90 (BP Location: Left Arm)   Pulse 84   Temp 98.2 F (36.8 C) (Oral)   Resp 18   Ht 5' (1.524 m)   Wt 68 kg   SpO2 95%   BMI 29.29 kg/m  Gen:   Awake, no distress   Resp:  Normal effort  MSK:   Moves extremities without difficulty  HENT:   PERRLA, EOM intact. No redness to conjunctiva.  Other:  LLE swelling with tenderness to palpation.   Medical Decision Making  Medically screening exam initiated at 7:06 PM.  Appropriate orders placed.  Ibeth Fahmy Rice-Clack was informed that the remainder of the evaluation will be completed by another provider, this initial triage assessment does not replace that evaluation, and the importance of remaining in the ED until their evaluation is complete.    Therese Sarah 10/09/20 1912    Linwood Dibbles, MD 10/10/20 1309

## 2020-10-09 NOTE — ED Notes (Signed)
Patient requesting RN to call husband to ask her to pick up Betty Berry food. Husband did not answer.

## 2020-10-09 NOTE — ED Notes (Signed)
Patient requesting medication for her headache. Dr. Rush Landmark notified.

## 2020-10-09 NOTE — ED Triage Notes (Signed)
Pt reports fatigue, fever, and pain and swelling to left leg and arm over the past few days. No cp or sob.

## 2020-10-09 NOTE — ED Provider Notes (Signed)
[facility]-EMERGENCY DEPT Provider Note   CSN: [id number] Arrival date & time: 10/09/20  1803     History Chief Complaint  Patient presents with   Fatigue   Fever   Leg Swelling    Betty Berry is a 60 y.o. female with a history of anxiety, depression, bipolar 1 disorder, hyperlipidemia who presents the emergency department with a chief complaint of headache.  The patient reports a 3-day history of a constant, waxing and waning headache to her left frontal region that is also present behind her bilateral eyes that she characterizes as pulsating.  Pain has been gradually worsening and reached maximal intensity today. She reports associated nasal congestion.  She does not have a history of headaches.  She reports that she is having tingling and feeling numb on her left arm and leg, especially in the fingers and toes.   She did have several episodes of vomiting earlier this week after she went out and drink alcohol with friends to celebrate her birthday.  She denies any illicit or recreational substance use.  She denies nausea and has had a good appetite.  She reports some bilateral lower pain in her abdomen that she is unable to characterize.  Pain has not been worsening.  She reports that her bowel movements have been loose.  No constipation, vaginal bleeding or discharge, dysuria, hematuria.  She denies chest pain, shortness of breath, but does report that her bilateral legs have been swelling over the last few days.  She has a history of similar, but reports this typically resolves shortly after it occurs and is worse this episode.  Reports that she has been more fatigued over the last 3 weeks.  States that she was feeling hot and feverish several days ago, but did not check her temperature and this is since resolved.  She also adds that she had 3 bowel movements with bright red blood over the last 3 days.  Reports a history of similar, but is unsure of what she was  diagnosed with.    She denies rash, neck pain or stiffness, tinnitus, rash.  Reports that she had a colonoscopy several years ago that was unremarkable.  No known sick contacts.  The history is provided by the patient and medical records. No language interpreter was used.      Past Medical History:  Diagnosis Date   Anxiety and depression    Bipolar 1 disorder (HCC)    Hyperlipidemia     There are no problems to display for this patient.   Past Surgical History:  Procedure Laterality Date   ABDOMINAL HYSTERECTOMY     TUMOR EXCISION     Right arm,  fatty tumor per patient     OB History   No obstetric history on file.     Family History  Problem Relation Age of Onset   Diabetes Mother    Hyperlipidemia Mother    Hypertension Mother    Varicose Veins Mother     Social History   Tobacco Use   Smoking status: Every Day    Packs/day: 0.25    Types: Cigarettes   Smokeless tobacco: Current   Tobacco comments:    Reports she smoke 2-3 cigs per day   Substance Use Topics   Alcohol use: Yes    Comment: Patient reports a glass of wine per day    Drug use: No    Home Medications Prior to Admission medications   Medication Sig Start Date  End Date Taking? Authorizing Provider  nitrofurantoin, macrocrystal-monohydrate, (MACROBID) 100 MG capsule Take 1 capsule (100 mg total) by mouth 2 (two) times daily. 10/10/20  Yes Dorothyann Mourer A, PA-C  clonazePAM (KLONOPIN) 0.5 MG tablet Take 0.5 mg by mouth 2 (two) times daily as needed for anxiety.    [provider]  doxycycline (VIBRAMYCIN) 100 MG capsule Take 1 capsule (100 mg total) by mouth 2 (two) times daily. 01/25/16   Linna HoffKindl, James D, MD  ipratropium (ATROVENT) 0.06 % nasal spray Place 2 sprays into both nostrils 4 (four) times daily. 01/25/16   Linna HoffKindl, James D, MD    Allergies    Codeine, Hydrocortisone, Penicillins, and Penicillins  Review of Systems   Review of Systems  Constitutional:  Positive for fatigue  and fever (resolved). Negative for activity change, chills and diaphoresis.  HENT:  Positive for congestion. Negative for ear pain, facial swelling, sinus pressure, sinus pain and sore throat.   Eyes:  Negative for photophobia, pain, redness and visual disturbance.  Respiratory:  Negative for cough, choking, shortness of breath and wheezing.   Cardiovascular:  Positive for leg swelling. Negative for chest pain.  Gastrointestinal:  Positive for abdominal distention, abdominal pain, blood in stool, diarrhea and vomiting (resolved). Negative for constipation and nausea.  Genitourinary:  Negative for dysuria, flank pain, frequency, hematuria, urgency, vaginal discharge and vaginal pain.  Musculoskeletal:  Negative for back pain, myalgias, neck pain and neck stiffness.  Skin:  Negative for rash.  Allergic/Immunologic: Negative for immunocompromised state.  Neurological:  Positive for headaches. Negative for dizziness, seizures, syncope, weakness, light-headedness and numbness.       Paresthesias  Psychiatric/Behavioral:  Negative for confusion.    Physical Exam Updated Vital Signs BP 130/80 (BP Location: Left Arm)   Pulse 80   Temp 98 F (36.7 C) (Oral)   Resp 15   Ht 5' (1.524 m)   Wt 68 kg   SpO2 100%   BMI 29.29 kg/m   Physical Exam Vitals and nursing note reviewed.  Constitutional:      General: She is not in acute distress.    Appearance: She is not ill-appearing, toxic-appearing or diaphoretic.  HENT:     Head: Normocephalic.  Eyes:     Conjunctiva/sclera: Conjunctivae normal.  Cardiovascular:     Rate and Rhythm: Normal rate and regular rhythm.     Heart sounds: No murmur heard.   No friction rub. No gallop.  Pulmonary:     Effort: Pulmonary effort is normal. No respiratory distress.     Breath sounds: No stridor. No wheezing, rhonchi or rales.  Chest:     Chest wall: No tenderness.  Abdominal:     General: There is no distension.     Palpations: Abdomen is soft.  There is no mass.     Tenderness: There is abdominal tenderness. There is no right CVA tenderness, left CVA tenderness, guarding or rebound.     Hernia: No hernia is present.     Comments: Normal tenderness palpation the bilateral lower abdomen.  No rebound or guarding.  No CVA tenderness bilaterally.  No tenderness over McBurney's point.  Negative Murphy sign.  Genitourinary:    Comments: Chaperoned exam.  No gross blood on digital rectal exam.  Rectal tone is normal.  Small nonthrombosed external hemorrhoid noted. Musculoskeletal:     Cervical back: Neck supple.     Comments: Mild swelling around the bilateral ankles.  Symmetric.  No pitting edema.  Skin:  General: Skin is warm.     Findings: No rash.  Neurological:     Mental Status: She is alert.     Comments: Mental Status: Patient is awake, alert, oriented to person, place, month, year, and situation. Patient is able to give a clear and coherent history. Cranial Nerves: II: Visual Fields are full. Pupils are equal, round, and reactive to light. III,IV, VI: EOMI without ptosis or diploplia.  V: Facial sensation is symmetric to temperature VII: Facial movement is symmetric.  VIII: hearing is intact to voice X: Uvula elevates symmetrically XI: Shoulder shrug is symmetric. XII: tongue is midline without atrophy or fasciculations.  Motor: Tone is normal. Bulk is normal. 5/5 strength was present in all four extremities.  Sensory: Sensation is symmetric to light touch and temperature in the arms and legs. Cerebellar: FNF intact bilaterally    Psychiatric:        Behavior: Behavior normal.    ED Results / Procedures / Treatments   Labs (all labs ordered are listed, but only abnormal results are displayed) Labs Reviewed  COMPREHENSIVE METABOLIC PANEL - Abnormal; Notable for the following components:      Result Value   Chloride 112 (*)    Glucose, Bld 123 (*)    All other components within normal limits  CBC WITH  DIFFERENTIAL/PLATELET - Abnormal; Notable for the following components:   MCV 102.8 (*)    All other components within normal limits  URINALYSIS, ROUTINE W REFLEX MICROSCOPIC - Abnormal; Notable for the following components:   Color, Urine YELLOW (*)    APPearance CLEAR (*)    Specific Gravity, Urine >1.030 (*)    Glucose, UA 100 (*)    Hgb urine dipstick LARGE (*)    Leukocytes,Ua MODERATE (*)    WBC, UA >50 (*)    Bacteria, UA RARE (*)    All other components within normal limits  RESP PANEL BY RT-PCR (FLU A&B, COVID) ARPGX2  URINE CULTURE  LIPASE, BLOOD  POC OCCULT BLOOD, ED    EKG None  Radiology No results found.  Procedures Procedures   Medications Ordered in ED Medications  prochlorperazine (COMPAZINE) injection 10 mg (10 mg Intravenous Given 10/09/20 2304)  diphenhydrAMINE (BENADRYL) injection 25 mg (25 mg Intravenous Given 10/09/20 2303)  sodium chloride 0.9 % bolus 1,000 mL (0 mLs Intravenous Stopped 10/10/20 0002)    ED Course  I have reviewed the triage vital signs and the nursing notes.  Pertinent labs & imaging results that were available during my care of the patient were reviewed by me and considered in my medical decision making (see chart for details).    MDM Rules/Calculators/A&P                           60 year old with a history of anxiety, depression, bipolar 1 disorder, hyperlipidemia who presents emergency department with a chief complaint of headache.  Headache has been accompanied by bilateral ankle swelling, lower abdominal pain, diarrhea, questionable fever several days ago, and several weeks of fatigue.  Vital signs are stable.  She does not have a surgical abdomen.  No neurologic deficits.  Labs and imaging of been reviewed and independently interpreted by me.  Patient did report that she had had 3 bloody bowel movements over the last few days.  Hemoccult was negative.  Hemoglobin is stable.  Doubt GI bleed.  Doubt ruptured peptic ulcer  disease.  UA is concerning for infection.  COVID-19 is  negative.  Lipase is normal.  Doubt pancreatitis, pyelonephritis, bowel obstruction, COVID-19, ACS, aortic dissection, CVA, meningitis.  Patient was given a migraine cocktail.  We will discharge the patient home with nitrofurantoin for UTI since she has been having suprapubic tenderness.  Urine culture is pending.  ER return precautions given.  She is well-appearing.  Nontoxic-appearing.  Safer discharge home with outpatient follow-up as discussed.  Final Clinical Impression(s) / ED Diagnoses Final diagnoses:  Acute cystitis without hematuria    Rx / DC Orders ED Discharge Orders          Ordered    nitrofurantoin, macrocrystal-monohydrate, (MACROBID) 100 MG capsule  2 times daily        10/10/20 0012             Kahlel Peake A, PA-C 10/10/20 0022    Gilda Crease, MD 10/10/20 [phone]

## 2020-10-10 MED ORDER — NITROFURANTOIN MONOHYD MACRO 100 MG PO CAPS: 100.0000 mg | ORAL_CAPSULE | Freq: Two times a day (BID) | ORAL | 0 refills | Status: AC

## 2020-10-10 NOTE — Discharge Instructions (Addendum)
Thank you for allowing me to care for you today in the Emergency Department.   Take 1 tablet of nitrofurantoin 2 times daily for the next 5 days.  Take 650 mg of Tylenol or 600 mg of ibuprofen with food every 6 hours for pain or headache.  You can alternate between these 2 medications every 3 hours if your pain returns.  For instance, you can take Tylenol at noon, followed by a dose of ibuprofen at 3, followed by second dose of Tylenol and 6.  If you continue to have swelling in your ankles, you can follow-up with your primary care provider.  If you do not have a primary care provider, call the number on your discharge paperwork to get established with one.  Try elevating your legs to see if your swelling improves.  Return to the emergency department if you have persistent fever, temperature of 100.5 degrees or greater, if you become unable to urinate, severe, uncontrollable abdominal pain, if you become unable to walk, or have other new, concerning symptoms.

## 2020-10-11 LAB — URINE CULTURE: Culture: <10000 — AB

## 2020-11-16 ENCOUNTER — Ambulatory Visit: Payer: Self-pay | Admitting: Nurse Practitioner

## 2022-08-30 ENCOUNTER — Ambulatory Visit: Payer: Medicaid Other | Admitting: Critical Care Medicine

## 2022-08-30 NOTE — Progress Notes (Deleted)
   New Patient Office Visit  Subjective    Patient ID: Betty Berry, female    DOB: [birth year]  Age: 62 y.o. MRN: [id number]  CC: No chief complaint on file.   HPI Betty Berry presents to establish care   Outpatient Encounter Medications as of 08/30/2022  Medication Sig   clonazePAM (KLONOPIN) 0.5 MG tablet Take 0.5 mg by mouth 2 (two) times daily as needed for anxiety.   doxycycline (VIBRAMYCIN) 100 MG capsule Take 1 capsule (100 mg total) by mouth 2 (two) times daily.   ipratropium (ATROVENT) 0.06 % nasal spray Place 2 sprays into both nostrils 4 (four) times daily.   nitrofurantoin, macrocrystal-monohydrate, (MACROBID) 100 MG capsule Take 1 capsule (100 mg total) by mouth 2 (two) times daily.   No facility-administered encounter medications on file as of 08/30/2022.    Past Medical History:  Diagnosis Date   Anxiety and depression    Bipolar 1 disorder (HCC)    Hyperlipidemia     Past Surgical History:  Procedure Laterality Date   ABDOMINAL HYSTERECTOMY     TUMOR EXCISION     Right arm,  fatty tumor per patient    Family History  Problem Relation Age of Onset   Diabetes Mother    Hyperlipidemia Mother    Hypertension Mother    Varicose Veins Mother     Social History   Socioeconomic History   Marital status: Unknown    Spouse name: Not on file   Number of children: Not on file   Years of education: Not on file   Highest education level: Not on file  Occupational History   Not on file  Tobacco Use   Smoking status: Every Day    Current packs/day: 0.25    Types: Cigarettes   Smokeless tobacco: Current   Tobacco comments:    Reports she smoke 2-3 cigs per day   Substance and Sexual Activity   Alcohol use: Yes    Comment: Patient reports a glass of wine per day    Drug use: No   Sexual activity: Yes    Birth control/protection: Surgical  Other Topics Concern   Not on file  Social History Narrative   ** Merged History Encounter **        Social Determinants of Health   Financial Resource Strain: Not on file  Food Insecurity: Not on file  Transportation Needs: Not on file  Physical Activity: Not on file  Stress: Not on file  Social Connections: Not on file  Intimate Partner Violence: Not on file    ROS      Objective    There were no vitals taken for this visit.  Physical Exam  {Labs (Optional):23779}    Assessment & Plan:   Problem List Items Addressed This Visit   None   No follow-ups on file.   Shan Levans, MD
